# Patient Record
Sex: Female | Born: 1950 | Race: White | Hispanic: No | Marital: Married | Smoking: Former smoker
Health system: Southern US, Community
[De-identification: ages and names within clinical notes are randomized; demographics above are authoritative.]

## PROBLEM LIST (undated history)

## (undated) DIAGNOSIS — F909 Attention-deficit hyperactivity disorder, unspecified type: Secondary | ICD-10-CM

## (undated) DIAGNOSIS — I10 Essential (primary) hypertension: Secondary | ICD-10-CM

## (undated) DIAGNOSIS — J45909 Unspecified asthma, uncomplicated: Secondary | ICD-10-CM

## (undated) DIAGNOSIS — F329 Major depressive disorder, single episode, unspecified: Secondary | ICD-10-CM

## (undated) DIAGNOSIS — L719 Rosacea, unspecified: Secondary | ICD-10-CM

## (undated) DIAGNOSIS — E039 Hypothyroidism, unspecified: Secondary | ICD-10-CM

## (undated) DIAGNOSIS — Z9889 Other specified postprocedural states: Secondary | ICD-10-CM

## (undated) DIAGNOSIS — F419 Anxiety disorder, unspecified: Secondary | ICD-10-CM

## (undated) DIAGNOSIS — F32A Depression, unspecified: Secondary | ICD-10-CM

## (undated) DIAGNOSIS — R079 Chest pain, unspecified: Secondary | ICD-10-CM

## (undated) HISTORY — PX: PARATHYROIDECTOMY: SHX19

## (undated) HISTORY — DX: Anxiety disorder, unspecified: F41.9

## (undated) HISTORY — DX: Attention-deficit hyperactivity disorder, unspecified type: F90.9

## (undated) HISTORY — PX: TONSILLECTOMY: SUR1361

## (undated) HISTORY — DX: Essential (primary) hypertension: I10

## (undated) HISTORY — DX: Major depressive disorder, single episode, unspecified: F32.9

## (undated) HISTORY — DX: Other specified postprocedural states: Z98.890

## (undated) HISTORY — PX: THYROIDECTOMY: SHX17

## (undated) HISTORY — DX: Depression, unspecified: F32.A

## (undated) HISTORY — DX: Unspecified asthma, uncomplicated: J45.909

## (undated) HISTORY — DX: Hypothyroidism, unspecified: E03.9

## (undated) HISTORY — DX: Rosacea, unspecified: L71.9

## (undated) HISTORY — DX: Chest pain, unspecified: R07.9

---

## 1998-12-21 ENCOUNTER — Ambulatory Visit (HOSPITAL_COMMUNITY): Admission: EM | Admit: 1998-12-21 | Discharge: 1998-12-21 | Payer: Self-pay

## 2000-01-17 ENCOUNTER — Encounter: Payer: Self-pay | Admitting: Endocrinology

## 2000-01-17 ENCOUNTER — Encounter: Admission: RE | Admit: 2000-01-17 | Discharge: 2000-01-17 | Payer: Self-pay | Admitting: Endocrinology

## 2001-01-30 ENCOUNTER — Other Ambulatory Visit: Admission: RE | Admit: 2001-01-30 | Discharge: 2001-01-30 | Payer: Self-pay | Admitting: Obstetrics and Gynecology

## 2002-01-05 ENCOUNTER — Encounter: Payer: Self-pay | Admitting: Obstetrics and Gynecology

## 2002-01-05 ENCOUNTER — Encounter: Admission: RE | Admit: 2002-01-05 | Discharge: 2002-01-05 | Payer: Self-pay | Admitting: Obstetrics and Gynecology

## 2002-02-16 ENCOUNTER — Other Ambulatory Visit: Admission: RE | Admit: 2002-02-16 | Discharge: 2002-02-16 | Payer: Self-pay | Admitting: Obstetrics and Gynecology

## 2002-07-22 ENCOUNTER — Ambulatory Visit (HOSPITAL_COMMUNITY): Admission: RE | Admit: 2002-07-22 | Discharge: 2002-07-22 | Payer: Self-pay | Admitting: Pulmonary Disease

## 2002-08-18 ENCOUNTER — Other Ambulatory Visit: Admission: RE | Admit: 2002-08-18 | Discharge: 2002-08-18 | Payer: Self-pay | Admitting: Obstetrics and Gynecology

## 2002-11-04 ENCOUNTER — Ambulatory Visit (HOSPITAL_COMMUNITY): Admission: RE | Admit: 2002-11-04 | Discharge: 2002-11-04 | Payer: Self-pay | Admitting: Internal Medicine

## 2002-12-23 ENCOUNTER — Ambulatory Visit (HOSPITAL_COMMUNITY): Admission: RE | Admit: 2002-12-23 | Discharge: 2002-12-23 | Payer: Self-pay | Admitting: Pulmonary Disease

## 2003-03-16 ENCOUNTER — Encounter: Payer: Self-pay | Admitting: Obstetrics and Gynecology

## 2003-03-16 ENCOUNTER — Encounter: Admission: RE | Admit: 2003-03-16 | Discharge: 2003-03-16 | Payer: Self-pay | Admitting: Obstetrics and Gynecology

## 2003-03-21 ENCOUNTER — Other Ambulatory Visit: Admission: RE | Admit: 2003-03-21 | Discharge: 2003-03-21 | Payer: Self-pay | Admitting: Obstetrics and Gynecology

## 2003-11-09 DIAGNOSIS — D229 Melanocytic nevi, unspecified: Secondary | ICD-10-CM

## 2003-11-09 HISTORY — DX: Melanocytic nevi, unspecified: D22.9

## 2004-04-17 ENCOUNTER — Encounter: Admission: RE | Admit: 2004-04-17 | Discharge: 2004-04-17 | Payer: Self-pay | Admitting: Obstetrics and Gynecology

## 2004-04-25 ENCOUNTER — Encounter: Admission: RE | Admit: 2004-04-25 | Discharge: 2004-04-25 | Payer: Self-pay | Admitting: Obstetrics and Gynecology

## 2005-10-23 ENCOUNTER — Encounter: Admission: RE | Admit: 2005-10-23 | Discharge: 2005-10-23 | Payer: Self-pay | Admitting: Obstetrics and Gynecology

## 2005-11-19 ENCOUNTER — Encounter: Admission: RE | Admit: 2005-11-19 | Discharge: 2005-11-19 | Payer: Self-pay | Admitting: Obstetrics and Gynecology

## 2006-12-12 ENCOUNTER — Encounter: Admission: RE | Admit: 2006-12-12 | Discharge: 2006-12-12 | Payer: Self-pay | Admitting: Obstetrics and Gynecology

## 2008-01-12 ENCOUNTER — Encounter: Admission: RE | Admit: 2008-01-12 | Discharge: 2008-01-12 | Payer: Self-pay | Admitting: Obstetrics and Gynecology

## 2009-02-15 ENCOUNTER — Encounter: Admission: RE | Admit: 2009-02-15 | Discharge: 2009-02-15 | Payer: Self-pay | Admitting: Obstetrics and Gynecology

## 2010-03-07 ENCOUNTER — Encounter: Admission: RE | Admit: 2010-03-07 | Discharge: 2010-03-07 | Payer: Self-pay | Admitting: Obstetrics and Gynecology

## 2010-04-10 ENCOUNTER — Ambulatory Visit (HOSPITAL_COMMUNITY): Admission: RE | Admit: 2010-04-10 | Discharge: 2010-04-10 | Payer: Self-pay | Admitting: Pulmonary Disease

## 2010-06-17 ENCOUNTER — Encounter: Payer: Self-pay | Admitting: Obstetrics and Gynecology

## 2010-10-12 NOTE — Op Note (Signed)
   NAME:  Olivia Arnold, Olivia Arnold                              ACCOUNT NO.:  [id number]   MEDICAL RECORD NO.:  [id number]                   PATIENT TYPE:  AMB   LOCATION:  DAY                                  FACILITY:  [facility]   PHYSICIAN:  Lionel December, M.D.                 DATE OF BIRTH:  09-06-1950   DATE OF PROCEDURE:  11/04/2002  DATE OF DISCHARGE:                                 OPERATIVE REPORT   PROCEDURE:  Total colonoscopy.   ENDOSCOPIST:  Lionel December, M.D.   INDICATIONS:  Tuesday is a 60 year old Caucasian female who is undergoing  screening colonoscopy.  She does not have any GI symptoms.  The procedures  were reviewed with the patient and informed consent was obtained.   PREOPERATIVE MEDICATIONS:  Fentanyl 50 mcg IV and Versed 7 mg IV in divided  dose.   INSTRUMENT:  Olympus video system.   FINDINGS:  Procedure performed in endoscopy suite.  The patient's vital  signs and O2 saturation were monitored during the procedure and remained  stable.  The patient was placed in the left lateral recumbent position and  rectal examination was performed.  This was within normal limits.   The scope was placed in the rectum and advanced under vision into the  sigmoid colon and beyond.  Preparation was excellent.  Scope was passed into  the cecum which was identified by intravenous and appendiceal orifice.  Pictures were taken for the record.  As the scope was withdrawn, the colonic  mucosa was carefully examined.  There were no polyps, tumor masses, or other  abnormalities.  The rectal mucosa was normal.   The scope was retroflexed to examine the anorectal junction which revealed  small hemorrhoids below the dentate line.  The endoscope was straightened  and withdrawn.  The patient tolerated the procedure well.    FINAL DIAGNOSIS:  Small external hemorrhoids otherwise normal colonoscopy.   RECOMMENDATIONS:  She should continue yearly Hemoccults and consider next  screening 10 years from  now.                                               Lionel December, M.D.    NR/MEDQ  D:  11/04/2002  T:  11/04/2002  Job:  [id number]   cc:   Ramon Dredge L. Juanetta Gosling, M.D.  [address]  [place]  [place] [address]  Fax: [phone]

## 2010-10-12 NOTE — Procedures (Signed)
   NAME:  Olivia Arnold, Olivia Arnold                              ACCOUNT NO.:  [id number]   MEDICAL RECORD NO.:  [id number]                   PATIENT TYPE:  OUT   LOCATION:  RESP                                 FACILITY:  [facility]   PHYSICIAN:  Edward L. Juanetta Gosling, M.D.             DATE OF BIRTH:  Sep 28, 1950   DATE OF PROCEDURE:  07/22/2002  DATE OF DISCHARGE:                              PULMONARY FUNCTION TEST   Spirometry shows mild air flow obstruction, most marked in the smaller  airways.                                               Edward L. Juanetta Gosling, M.D.    ELH/MEDQ  D:  07/22/2002  T:  07/23/2002  Job:  [id number]

## 2011-03-29 ENCOUNTER — Other Ambulatory Visit: Payer: Self-pay | Admitting: Obstetrics and Gynecology

## 2011-03-29 DIAGNOSIS — Z1231 Encounter for screening mammogram for malignant neoplasm of breast: Secondary | ICD-10-CM

## 2011-04-23 ENCOUNTER — Ambulatory Visit: Payer: Self-pay

## 2011-05-07 ENCOUNTER — Ambulatory Visit
Admission: RE | Admit: 2011-05-07 | Discharge: 2011-05-07 | Disposition: A | Discharge status: Home / Self Care | Payer: BC Managed Care – PPO | Source: Ambulatory Visit | Attending: Obstetrics and Gynecology | Admitting: Obstetrics and Gynecology

## 2011-05-07 DIAGNOSIS — Z1231 Encounter for screening mammogram for malignant neoplasm of breast: Secondary | ICD-10-CM

## 2012-05-29 ENCOUNTER — Other Ambulatory Visit: Payer: Self-pay | Admitting: Obstetrics and Gynecology

## 2012-05-29 DIAGNOSIS — Z1231 Encounter for screening mammogram for malignant neoplasm of breast: Secondary | ICD-10-CM

## 2012-06-26 ENCOUNTER — Ambulatory Visit
Admission: RE | Admit: 2012-06-26 | Discharge: 2012-06-26 | Disposition: A | Discharge status: Home / Self Care | Payer: BC Managed Care – PPO | Source: Ambulatory Visit | Attending: Obstetrics and Gynecology | Admitting: Obstetrics and Gynecology

## 2012-06-26 DIAGNOSIS — Z1231 Encounter for screening mammogram for malignant neoplasm of breast: Secondary | ICD-10-CM

## 2012-06-30 ENCOUNTER — Other Ambulatory Visit: Payer: Self-pay | Admitting: Obstetrics and Gynecology

## 2012-06-30 DIAGNOSIS — R928 Other abnormal and inconclusive findings on diagnostic imaging of breast: Secondary | ICD-10-CM

## 2012-07-10 ENCOUNTER — Other Ambulatory Visit: Payer: BC Managed Care – PPO

## 2012-07-21 ENCOUNTER — Other Ambulatory Visit: Payer: BC Managed Care – PPO

## 2012-07-31 ENCOUNTER — Other Ambulatory Visit: Payer: BC Managed Care – PPO

## 2012-08-11 ENCOUNTER — Other Ambulatory Visit: Payer: BC Managed Care – PPO

## 2012-08-21 ENCOUNTER — Ambulatory Visit
Admission: RE | Admit: 2012-08-21 | Discharge: 2012-08-21 | Disposition: A | Discharge status: Home / Self Care | Payer: BC Managed Care – PPO | Source: Ambulatory Visit | Attending: Obstetrics and Gynecology | Admitting: Obstetrics and Gynecology

## 2012-08-21 DIAGNOSIS — R928 Other abnormal and inconclusive findings on diagnostic imaging of breast: Secondary | ICD-10-CM

## 2013-08-23 ENCOUNTER — Other Ambulatory Visit: Payer: Self-pay

## 2013-08-23 DIAGNOSIS — Z1231 Encounter for screening mammogram for malignant neoplasm of breast: Secondary | ICD-10-CM

## 2013-09-20 ENCOUNTER — Ambulatory Visit
Admission: RE | Admit: 2013-09-20 | Discharge: 2013-09-20 | Disposition: A | Discharge status: Home / Self Care | Payer: BC Managed Care – PPO | Source: Ambulatory Visit

## 2013-09-20 DIAGNOSIS — Z1231 Encounter for screening mammogram for malignant neoplasm of breast: Secondary | ICD-10-CM

## 2014-08-11 ENCOUNTER — Other Ambulatory Visit (HOSPITAL_COMMUNITY): Payer: Self-pay | Admitting: Radiology

## 2014-08-11 DIAGNOSIS — J45909 Unspecified asthma, uncomplicated: Secondary | ICD-10-CM

## 2014-08-24 ENCOUNTER — Ambulatory Visit (HOSPITAL_COMMUNITY)
Admission: RE | Admit: 2014-08-24 | Discharge: 2014-08-24 | Disposition: A | Discharge status: Home / Self Care | Payer: BC Managed Care – PPO | Source: Ambulatory Visit | Attending: Pulmonary Disease | Admitting: Pulmonary Disease

## 2014-08-24 DIAGNOSIS — J45909 Unspecified asthma, uncomplicated: Secondary | ICD-10-CM | POA: Insufficient documentation

## 2014-08-24 MED ORDER — ALBUTEROL SULFATE (2.5 MG/3ML) 0.083% IN NEBU
2.5000 mg | INHALATION_SOLUTION | Freq: Once | RESPIRATORY_TRACT | Status: AC
Start: 2014-08-24 — End: 2014-08-24
  Administered 2014-08-24: 2.5 mg via RESPIRATORY_TRACT

## 2014-09-01 LAB — PULMONARY FUNCTION TEST
DL/VA % PRED: 76 %
DL/VA: 3.66 ml/min/mmHg/L
DLCO UNC % PRED: 68 %
DLCO UNC: 16.71 ml/min/mmHg
FEF 25-75 POST: 2.06 L/s
FEF 25-75 PRE: 1.89 L/s
FEF2575-%Change-Post: 8 %
FEF2575-%PRED-POST: 93 %
FEF2575-%Pred-Pre: 85 %
FEV1-%CHANGE-POST: 3 %
FEV1-%Pred-Post: 95 %
FEV1-%Pred-Pre: 92 %
FEV1-Post: 2.35 L
FEV1-Pre: 2.28 L
FEV1FVC-%CHANGE-POST: 4 %
FEV1FVC-%Pred-Pre: 98 %
FEV6-%Change-Post: -1 %
FEV6-%Pred-Post: 96 %
FEV6-%Pred-Pre: 97 %
FEV6-POST: 2.97 L
FEV6-PRE: 3.01 L
FEV6FVC-%CHANGE-POST: 0 %
FEV6FVC-%PRED-POST: 104 %
FEV6FVC-%Pred-Pre: 104 %
FVC-%Change-Post: -1 %
FVC-%PRED-POST: 92 %
FVC-%PRED-PRE: 93 %
FVC-POST: 2.97 L
FVC-Pre: 3.01 L
POST FEV1/FVC RATIO: 79 %
Post FEV6/FVC ratio: 100 %
Pre FEV1/FVC ratio: 76 %
Pre FEV6/FVC Ratio: 100 %
RV % PRED: 98 %
RV: 2.02 L
TLC % PRED: 97 %
TLC: 4.93 L

## 2014-09-14 ENCOUNTER — Ambulatory Visit (HOSPITAL_COMMUNITY)
Admission: RE | Admit: 2014-09-14 | Discharge: 2014-09-14 | Disposition: A | Discharge status: Home / Self Care | Payer: BC Managed Care – PPO | Source: Ambulatory Visit | Attending: Pulmonary Disease | Admitting: Pulmonary Disease

## 2014-09-14 ENCOUNTER — Other Ambulatory Visit (HOSPITAL_COMMUNITY): Payer: Self-pay | Admitting: Pulmonary Disease

## 2014-09-14 DIAGNOSIS — M79641 Pain in right hand: Secondary | ICD-10-CM | POA: Diagnosis present

## 2014-09-20 DIAGNOSIS — E039 Hypothyroidism, unspecified: Secondary | ICD-10-CM | POA: Insufficient documentation

## 2014-09-20 DIAGNOSIS — F988 Other specified behavioral and emotional disorders with onset usually occurring in childhood and adolescence: Secondary | ICD-10-CM | POA: Insufficient documentation

## 2014-09-20 DIAGNOSIS — F329 Major depressive disorder, single episode, unspecified: Secondary | ICD-10-CM | POA: Insufficient documentation

## 2014-09-20 DIAGNOSIS — J45909 Unspecified asthma, uncomplicated: Secondary | ICD-10-CM | POA: Insufficient documentation

## 2014-09-20 DIAGNOSIS — F320 Major depressive disorder, single episode, mild: Secondary | ICD-10-CM

## 2014-09-20 DIAGNOSIS — F419 Anxiety disorder, unspecified: Secondary | ICD-10-CM | POA: Insufficient documentation

## 2014-09-21 ENCOUNTER — Encounter: Payer: Self-pay | Admitting: Cardiology

## 2014-09-21 ENCOUNTER — Ambulatory Visit (INDEPENDENT_AMBULATORY_CARE_PROVIDER_SITE_OTHER): Payer: BC Managed Care – PPO | Admitting: Cardiology

## 2014-09-21 VITALS — BP 136/76 | HR 83 | Ht 64.0 in | Wt 175.0 lb

## 2014-09-21 DIAGNOSIS — Z136 Encounter for screening for cardiovascular disorders: Secondary | ICD-10-CM | POA: Diagnosis not present

## 2014-09-21 DIAGNOSIS — F17201 Nicotine dependence, unspecified, in remission: Secondary | ICD-10-CM

## 2014-09-21 DIAGNOSIS — R0602 Shortness of breath: Secondary | ICD-10-CM | POA: Diagnosis not present

## 2014-09-21 DIAGNOSIS — Z8249 Family history of ischemic heart disease and other diseases of the circulatory system: Secondary | ICD-10-CM | POA: Diagnosis not present

## 2014-09-21 DIAGNOSIS — I10 Essential (primary) hypertension: Secondary | ICD-10-CM | POA: Diagnosis not present

## 2014-09-21 NOTE — Progress Notes (Signed)
Cardiology Office Note  Date: 09/21/2014   ID: Olivia Arnold, DOB 1951/01/28, MRN [id number]  PCP: Alonza Bogus, MD  Primary Cardiologist: Rozann Lesches, MD   Chief Complaint  Patient presents with  . Cardiac evaluation    History of Present Illness: Olivia Arnold is a 64 y.o. female referred for cardiology consultation by Dr. Luan Pulling. She is here today with a friend helping her to take notes and remember details of the office visit. She is referred after having had an episode of exertional shortness of breath that occurred after walking up a flight of steep steps carrying a baby. This happened approximately 3-4 weeks ago when she was in [place] for a wedding. She tells me that he has 2 brothers with diagnosed CAD, 1 status post heart attack at age 72, and the other just recently diagnosed with multivessel disease requiring CABG in his early 35s. She reports a personal history of hypertension, has not had problems with significantly elevated lipids or diabetes mellitus. She actually underwent a heart catheterization at [facility] back in 2000 that demonstrated normal coronary arteries - I reviewed a note from Dr. Clovia Cuff.  She recently had normal PFTs in early April per Dr. Luan Pulling. Otherwise has not undergone any recent ischemic testing.  ECG obtained today was normal.  I reviewed her recent lab work from Dr. Luan Pulling.  She describes herself as being active, enjoys [place]aking [place], also hiking. She does not endorse any reproducible angina symptoms or claudication.   Past Medical History  Diagnosis Date  . Hypothyroidism   . Depression   . Anxiety   . ADHD (attention deficit hyperactivity disorder)   . Asthma   . History of cardiac catheterization     [facility] July 2000 - normal coronaries  . Essential hypertension     Past Surgical History  Procedure Laterality Date  . Parathyroidectomy    . Tonsillectomy    . Thyroidectomy      Current Outpatient  Prescriptions  Medication Sig Dispense Refill  . ALPRAZolam (XANAX) 1 MG tablet Take 1 mg by mouth 3 (three) times daily as needed for anxiety.    Marland Kitchen amphetamine-dextroamphetamine (ADDERALL) 20 MG tablet Take 20 mg by mouth daily.    . DULoxetine (CYMBALTA) 60 MG capsule Take 60 mg by mouth daily.    . folic acid (FOLVITE) 1 MG tablet Take 1 mg by mouth daily.    Marland Kitchen levothyroxine (SYNTHROID, LEVOTHROID) 100 MCG tablet Take 100 mcg by mouth daily before breakfast.    . losartan (COZAAR) 100 MG tablet Take 100 mg by mouth daily.    . montelukast (SINGULAIR) 10 MG tablet Take 10 mg by mouth at bedtime.    . Omega-3 Fatty Acids (FISH OIL) 1000 MG CAPS Take 1,000 mg by mouth daily.    . vitamin B-12 (CYANOCOBALAMIN) 500 MCG tablet Take 500 mcg by mouth daily.     No current facility-administered medications for this visit.    Allergies:  Review of patient's allergies indicates no known allergies.   Social History: The patient  reports that she has quit smoking. Her smoking use included Cigarettes. She started smoking about 46 years ago. She smoked 1.00 pack per day. She has never used smokeless tobacco. She reports that she drinks alcohol. She reports that she does not use illicit drugs.   Family History: The patient's family history includes Aneurysm in her mother; CAD in her brother; Heart attack in her brother.  ROS:  Please see the history of present illness. Otherwise, complete review of systems is positive for anxiety.  All other systems are reviewed and negative.   Physical Exam: VS:  BP 136/76 mmHg  Pulse 83  Ht 5\' 4"  (1.626 m)  Wt 175 lb (79.379 kg)  BMI 30.02 kg/m2  SpO2 98%, BMI Body mass index is 30.02 kg/(m^2).  Wt Readings from Last 3 Encounters:  09/21/14 175 lb (79.379 kg)     General: Patient appears comfortable at rest. HEENT: Conjunctiva and lids normal, oropharynx clear with moist mucosa. Neck: Supple, no elevated JVP or carotid bruits, no thyromegaly. Lungs: Clear  to auscultation, nonlabored breathing at rest. Cardiac: Regular rate and rhythm, no S3 or significant systolic murmur, no pericardial rub. Abdomen: Soft, nontender, bowel sounds present. Extremities: No pitting edema, distal pulses 2+. Skin: Warm and dry. Musculoskeletal: No kyphosis. Neuropsychiatric: Alert and oriented x3, affect grossly appropriate.   ECG: ECG is ordered today and shows normal sinus rhythm.  Recent Labwork: 08/04/2014: Hemoglobin 13.2, platelets 372, potassium 4.5, BUN 22, creatinine 0.7, AST 18, ALT 18, cholesterol 185, triglycerides 133, HDL 53, LDL 105, TSH 1.6   ASSESSMENT AND PLAN:  1. Recent episode of exertional shortness of breath, symptoms not typical with her regular activities. She has a family history of premature cardiovascular disease in brothers, personal history of hypertension. Remote cardiac catheterization at [facility] in 2000 showed normal coronary arteries, she has not had interval ischemic workup. Recent PFTs were normal as well. ECG today is normal. Plan is to proceed with an exercise echocardiogram for further risk stratification.  2. Essential hypertension, on Cozaar.  3. Prior history of tobacco use long-term.  Current medicines were reviewed at length with the patient today.   Orders Placed This Encounter  Procedures  . EKG 12-Lead  . Echocardiogram stress test without contrast    Disposition: Call with test results.   Signed, Satira Sark, MD, [facility] 09/21/2014 3:26 PM    [place] [facility] [address], [place], [place] [address] Phone: [phone]; Fax: [phone]

## 2014-09-21 NOTE — Patient Instructions (Signed)
Your physician recommends that you schedule a follow-up appointment in: to be determined after test    Your physician has requested that you have a stress echocardiogram. For further information please visit [email or web address]. Please follow instruction sheet as given.       Thank you for choosing [facility] !

## 2014-09-26 ENCOUNTER — Ambulatory Visit (HOSPITAL_COMMUNITY)
Admission: RE | Admit: 2014-09-26 | Discharge: 2014-09-26 | Disposition: A | Discharge status: Home / Self Care | Payer: BC Managed Care – PPO | Source: Ambulatory Visit | Attending: Cardiology | Admitting: Cardiology

## 2014-09-26 DIAGNOSIS — Z8249 Family history of ischemic heart disease and other diseases of the circulatory system: Secondary | ICD-10-CM

## 2014-09-26 DIAGNOSIS — R0602 Shortness of breath: Secondary | ICD-10-CM

## 2014-09-26 DIAGNOSIS — R0600 Dyspnea, unspecified: Secondary | ICD-10-CM | POA: Insufficient documentation

## 2014-09-26 NOTE — Progress Notes (Signed)
Stress Lab Nurses Notes - [facility] Na  Olivia Arnold 09/26/2014 Reason for doing test: Dyspnea Type of test: Stress Echo Nurse performing test: Gerrit Halls, RN Nuclear Medicine Tech: Not Applicable Echo Tech: Titus Mould MD performing test: Myles Gip Family MD: Luan Pulling Test explained and consent signed: Yes.   IV started: No IV started Symptoms: SOB Treatment/Intervention: None Reason test stopped: fatigue After recovery IV was: NA Patient to return to Nuc. Med at : NA Patient discharged: Home Patient's Condition upon discharge was: stable Comments: During test Peak BP 181/62 & HR 150.  Recovery BP 129/70 & HR 89 .  Symptoms resolved in recovery. Geanie Cooley T

## 2014-09-28 ENCOUNTER — Telehealth: Payer: Self-pay | Admitting: Cardiovascular Disease

## 2014-09-28 NOTE — Telephone Encounter (Signed)
Patient wants results of tests / tg

## 2014-09-28 NOTE — Telephone Encounter (Signed)
LM that test has not been resulted and i will call her as soon as I get them

## 2014-10-05 NOTE — Telephone Encounter (Signed)
Patient would like to have her results from her test she had back in April!

## 2014-10-06 NOTE — Telephone Encounter (Signed)
Noted. I reviewed the stress test on May 2, the day that it was done. It does not look like the final report got merged into EPIC to show up as a final result, I suspect that it is related to the upgrade to the Cup[employer] system that occurred the day before. Please call the patient back and apologize for the delay in getting her a result. Overall the treadmill portion of the stress test indicates a moderate cardiovascular risk, which we suspected from her history alone. Importantly however, the echocardiographic images did not support any clear evidence of obstructive CAD at this point. I would recommend risk factor modification, however if she continues to have worsening symptoms, we can certainly pursue things further.

## 2014-10-06 NOTE — Telephone Encounter (Signed)
Pt requesting stress echo results done on 09/26/14.I am unable to fine resulted note

## 2014-10-06 NOTE — Telephone Encounter (Signed)
Copy routed to pcp

## 2014-10-06 NOTE — Telephone Encounter (Signed)
Patient notified of results.

## 2015-02-28 ENCOUNTER — Other Ambulatory Visit: Payer: Self-pay

## 2015-02-28 DIAGNOSIS — Z1231 Encounter for screening mammogram for malignant neoplasm of breast: Secondary | ICD-10-CM

## 2015-04-05 ENCOUNTER — Ambulatory Visit
Admission: RE | Admit: 2015-04-05 | Discharge: 2015-04-05 | Disposition: A | Discharge status: Home / Self Care | Payer: BC Managed Care – PPO | Source: Ambulatory Visit

## 2015-04-05 DIAGNOSIS — Z1231 Encounter for screening mammogram for malignant neoplasm of breast: Secondary | ICD-10-CM

## 2015-06-09 IMAGING — DX DG HAND COMPLETE 3+V*R*
3 series · 3 of 3 positions shown · non-contrast
Comparison: None.

CLINICAL DATA: Right hand pain. No known injury. Initial
evaluation.

EXAM:
RIGHT HAND - COMPLETE 3+ VIEW

[hand pa]
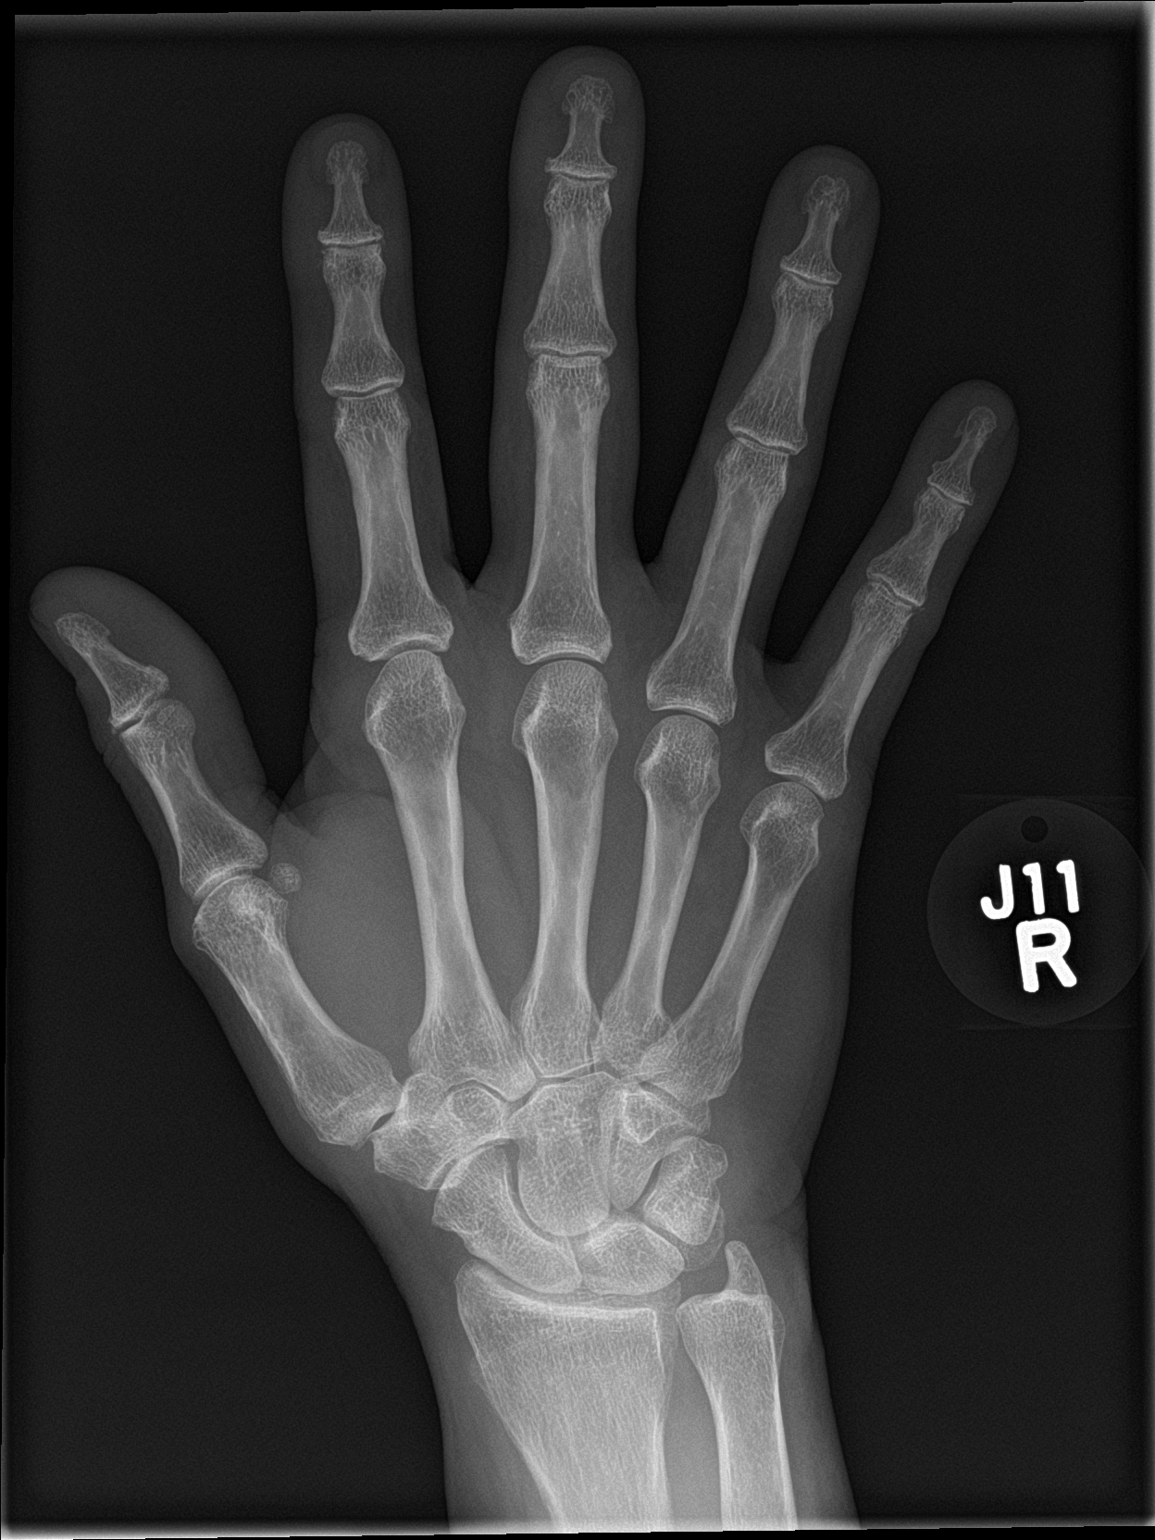

[hand obl]
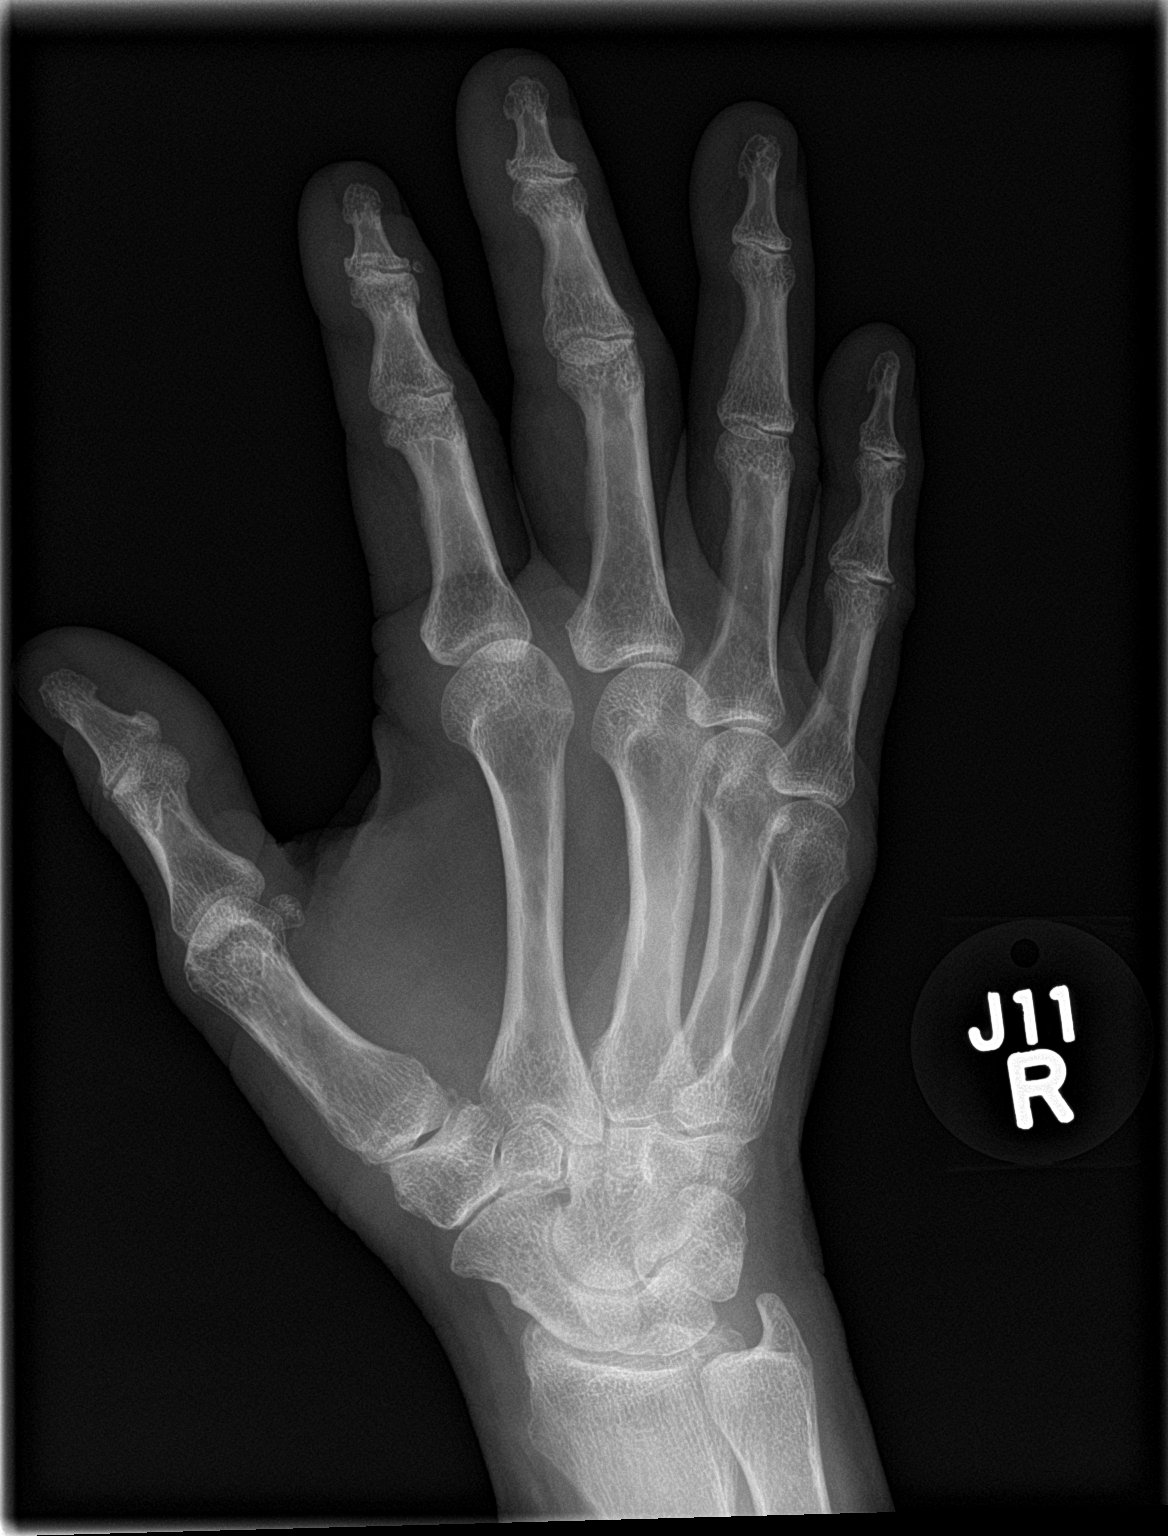

[hand lat]
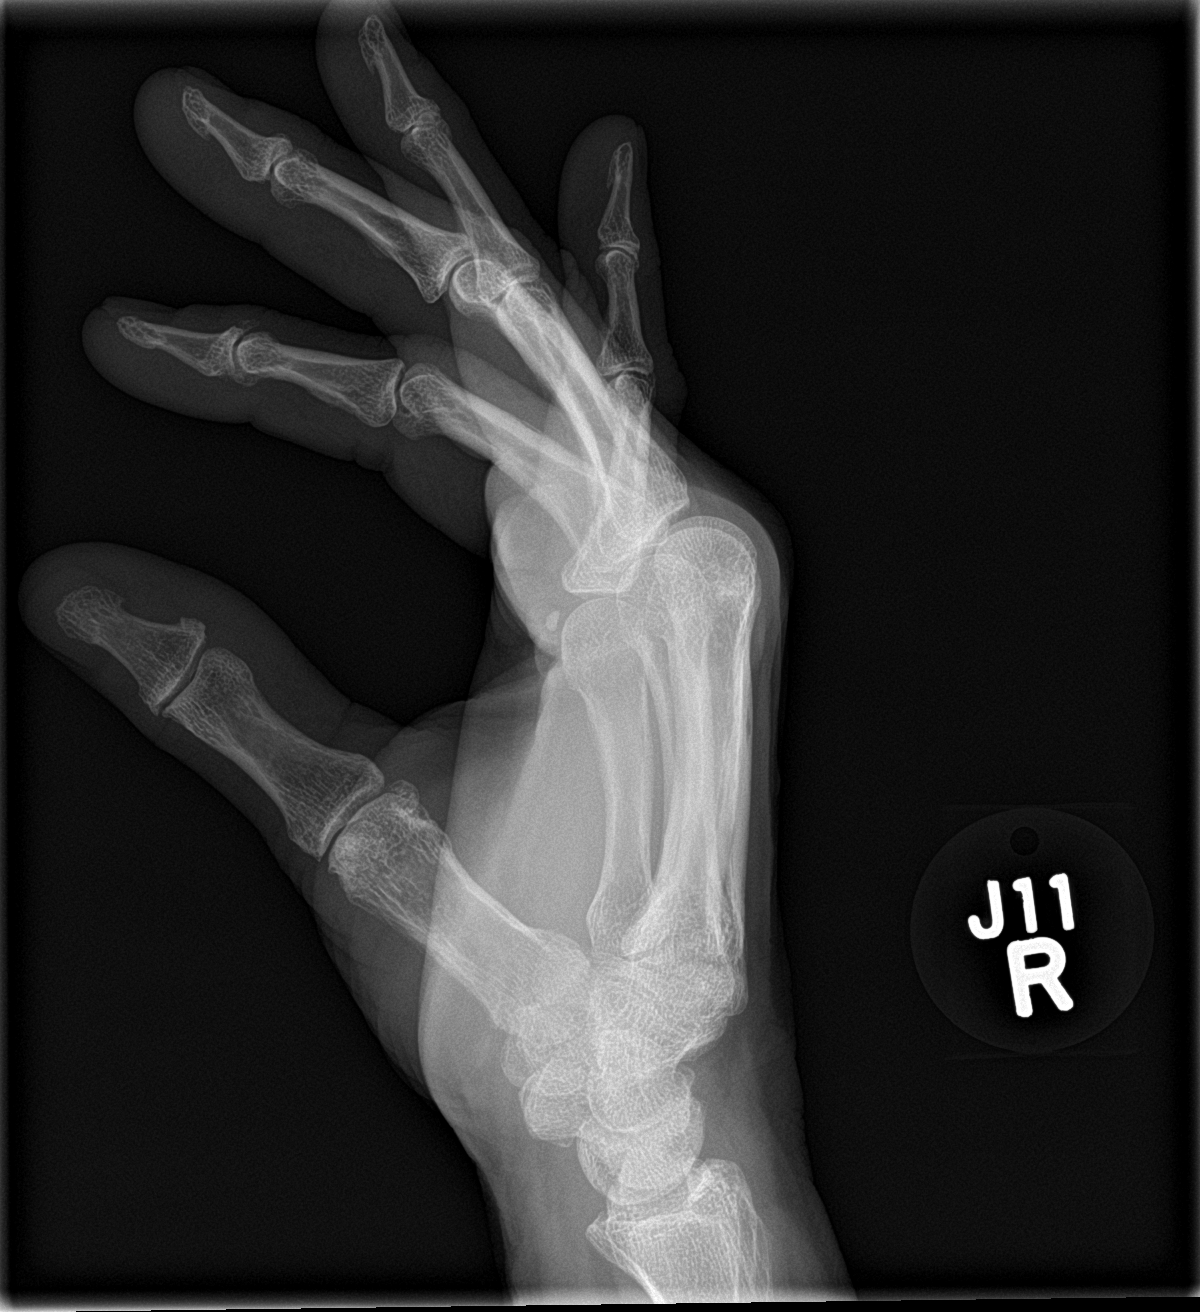

[3 of 3 positions shown; findings below may reference images not displayed]

FINDINGS: There is no evidence of fracture or dislocation. There is no
evidence of arthropathy or other focal bone abnormality. Soft
tissues are unremarkable.
IMPRESSION: No acute abnormality.

## 2015-07-17 ENCOUNTER — Encounter (HOSPITAL_COMMUNITY): Payer: Self-pay | Admitting: Emergency Medicine

## 2015-07-17 ENCOUNTER — Emergency Department (HOSPITAL_COMMUNITY)
Admission: EM | Admit: 2015-07-17 | Discharge: 2015-07-17 | Disposition: A | Discharge status: Home / Self Care | Payer: BC Managed Care – PPO | Attending: Emergency Medicine | Admitting: Emergency Medicine

## 2015-07-17 DIAGNOSIS — Y929 Unspecified place or not applicable: Secondary | ICD-10-CM | POA: Diagnosis not present

## 2015-07-17 DIAGNOSIS — Y9389 Activity, other specified: Secondary | ICD-10-CM | POA: Insufficient documentation

## 2015-07-17 DIAGNOSIS — W01118A Fall on same level from slipping, tripping and stumbling with subsequent striking against other sharp object, initial encounter: Secondary | ICD-10-CM | POA: Diagnosis not present

## 2015-07-17 DIAGNOSIS — J45909 Unspecified asthma, uncomplicated: Secondary | ICD-10-CM | POA: Insufficient documentation

## 2015-07-17 DIAGNOSIS — E039 Hypothyroidism, unspecified: Secondary | ICD-10-CM | POA: Insufficient documentation

## 2015-07-17 DIAGNOSIS — Z87891 Personal history of nicotine dependence: Secondary | ICD-10-CM | POA: Diagnosis not present

## 2015-07-17 DIAGNOSIS — I10 Essential (primary) hypertension: Secondary | ICD-10-CM | POA: Diagnosis not present

## 2015-07-17 DIAGNOSIS — Y998 Other external cause status: Secondary | ICD-10-CM | POA: Diagnosis not present

## 2015-07-17 DIAGNOSIS — S01422A Laceration with foreign body of left cheek and temporomandibular area, initial encounter: Secondary | ICD-10-CM | POA: Diagnosis not present

## 2015-07-17 DIAGNOSIS — Z79899 Other long term (current) drug therapy: Secondary | ICD-10-CM | POA: Diagnosis not present

## 2015-07-17 DIAGNOSIS — F329 Major depressive disorder, single episode, unspecified: Secondary | ICD-10-CM | POA: Insufficient documentation

## 2015-07-17 DIAGNOSIS — S0181XA Laceration without foreign body of other part of head, initial encounter: Secondary | ICD-10-CM

## 2015-07-17 DIAGNOSIS — S0993XA Unspecified injury of face, initial encounter: Secondary | ICD-10-CM | POA: Diagnosis present

## 2015-07-17 MED ORDER — LIDOCAINE HCL (PF) 1 % IJ SOLN
5.0000 mL | Freq: Once | INTRAMUSCULAR | Status: AC
  Administered 2015-07-17: 5 mL via INTRADERMAL
  Filled 2015-07-17: qty 5

## 2015-07-17 MED ORDER — BACITRACIN ZINC 500 UNIT/GM EX OINT
TOPICAL_OINTMENT | Freq: Once | CUTANEOUS | Status: AC
Start: 2015-07-17 — End: 2015-07-17
  Administered 2015-07-17: 1 via TOPICAL
  Filled 2015-07-17: qty 0.9

## 2015-07-17 MED ORDER — LIDOCAINE-EPINEPHRINE-TETRACAINE (LET) SOLUTION
3.0000 mL | Freq: Once | NASAL | Status: AC
  Administered 2015-07-17: 3 mL via TOPICAL
  Filled 2015-07-17: qty 3

## 2015-07-17 NOTE — Discharge Instructions (Signed)
Laceration Care, Adult  A laceration is a cut that goes through all layers of the skin. The cut also goes into the tissue that is right under the skin. Some cuts heal on their own. Others need to be closed with stitches (sutures), staples, skin adhesive strips, or wound glue. Taking care of your cut lowers your risk of infection and helps your cut to heal better.  HOW TO TAKE CARE OF YOUR CUT  For stitches or staples:  · Keep the wound clean and dry.  · If you were given a bandage (dressing), you should change it at least one time per day or as told by your doctor. You should also change it if it gets wet or dirty.  · Keep the wound completely dry for the first 24 hours or as told by your doctor. After that time, you may take a shower or a bath. However, make sure that the wound is not soaked in water until after the stitches or staples have been removed.  · Clean the wound one time each day or as told by your doctor:    Wash the wound with soap and water.    Rinse the wound with water until all of the soap comes off.    Pat the wound dry with a clean towel. Do not rub the wound.  · After you clean the wound, put a thin layer of antibiotic ointment on it as told by your doctor. This ointment:    Helps to prevent infection.    Keeps the bandage from sticking to the wound.  · Have your stitches or staples removed as told by your doctor.  If your doctor used skin adhesive strips:   · Keep the wound clean and dry.  · If you were given a bandage, you should change it at least one time per day or as told by your doctor. You should also change it if it gets dirty or wet.  · Do not get the skin adhesive strips wet. You can take a shower or a bath, but be careful to keep the wound dry.  · If the wound gets wet, pat it dry with a clean towel. Do not rub the wound.  · Skin adhesive strips fall off on their own. You can trim the strips as the wound heals. Do not remove any strips that are still stuck to the wound. They will  fall off after a while.  If your doctor used wound glue:  · Try to keep your wound dry, but you may briefly wet it in the shower or bath. Do not soak the wound in water, such as by swimming.  · After you take a shower or a bath, gently pat the wound dry with a clean towel. Do not rub the wound.  · Do not do any activities that will make you really sweaty until the skin glue has fallen off on its own.  · Do not apply liquid, cream, or ointment medicine to your wound while the skin glue is still on.  · If you were given a bandage, you should change it at least one time per day or as told by your doctor. You should also change it if it gets dirty or wet.  · If a bandage is placed over the wound, do not let the tape for the bandage touch the skin glue.  · Do not pick at the glue. The skin glue usually stays on for 5-10 days. Then, it   falls off of the skin.  General Instructions   · To help prevent scarring, make sure to cover your wound with sunscreen whenever you are outside after stitches are removed, after adhesive strips are removed, or when wound glue stays in place and the wound is healed. Make sure to wear a sunscreen of at least 30 SPF.  · Take over-the-counter and prescription medicines only as told by your doctor.  · If you were given antibiotic medicine or ointment, take or apply it as told by your doctor. Do not stop using the antibiotic even if your wound is getting better.  · Do not scratch or pick at the wound.  · Keep all follow-up visits as told by your doctor. This is important.  · Check your wound every day for signs of infection. Watch for:    Redness, swelling, or pain.    Fluid, blood, or pus.  · Raise (elevate) the injured area above the level of your heart while you are sitting or lying down, if possible.  GET HELP IF:  · You got a tetanus shot and you have any of these problems at the injection site:    Swelling.    Very bad pain.    Redness.    Bleeding.  · You have a fever.  · A wound that was  closed breaks open.  · You notice a bad smell coming from your wound or your bandage.  · You notice something coming out of the wound, such as wood or glass.  · Medicine does not help your pain.  · You have more redness, swelling, or pain at the site of your wound.  · You have fluid, blood, or pus coming from your wound.  · You notice a change in the color of your skin near your wound.  · You need to change the bandage often because fluid, blood, or pus is coming from the wound.  · You start to have a new rash.  · You start to have numbness around the wound.  GET HELP RIGHT AWAY IF:  · You have very bad swelling around the wound.  · Your pain suddenly gets worse and is very bad.  · You notice painful lumps near the wound or on skin that is anywhere on your body.  · You have a red streak going away from your wound.  · The wound is on your hand or foot and you cannot move a finger or toe like you usually can.  · The wound is on your hand or foot and you notice that your fingers or toes look pale or bluish.     This information is not intended to replace advice given to you by your health care provider. Make sure you discuss any questions you have with your health care provider.     Document Released: 10/30/2007 Document Revised: 09/27/2014 Document Reviewed: 05/09/2014  Elsevier Interactive Patient Education ©2016 [employer] Inc[employer]

## 2015-07-17 NOTE — ED Notes (Signed)
Pt states she tripped and fell on vase outside. Pt has laceration to the left cheek.

## 2015-07-17 NOTE — ED Notes (Signed)
Pt states understanding of care given and follow up instructions 

## 2015-07-17 NOTE — ED Provider Notes (Signed)
CSN: [id number]     Arrival date & time 07/17/15  1824 History   First MD Initiated Contact with Patient 07/17/15 2016     Chief Complaint  Patient presents with  . Facial Laceration     (Consider location/radiation/quality/duration/timing/severity/associated sxs/prior Treatment) HPI  Olivia Arnold is a 65 y.o. female who presents to the Emergency Department complaining of laceration to the left face that occurred just shortly before ED arrival.  She states that she tripped and fell over a glass vase causing the laceration.  She controlled the bleeding with application of pressure.  She also reports possible retained glass within the wound.  She denies head injury, neck pain, LOC, dizziness or headaches.     Past Medical History  Diagnosis Date  . Hypothyroidism   . Depression   . Anxiety   . ADHD (attention deficit hyperactivity disorder)   . Asthma   . History of cardiac catheterization     [facility] July 2000 - normal coronaries  . Essential hypertension    Past Surgical History  Procedure Laterality Date  . Parathyroidectomy    . Tonsillectomy    . Thyroidectomy     Family History  Problem Relation Age of Onset  . Heart attack Brother     Died age 2  . CAD Brother     Diagnosed age 5  . Aneurysm Mother     Brain   Social History  Substance Use Topics  . Smoking status: Former Smoker -- 1.00 packs/day    Types: Cigarettes    Start date: 09/20/1968  . Smokeless tobacco: Never Used  . Alcohol Use: 0.0 oz/week    0 Standard drinks or equivalent per week   OB History    No data available     Review of Systems  Constitutional: Negative for fever and chills.  Eyes: Negative for pain and visual disturbance.  Gastrointestinal: Negative for nausea and vomiting.  Musculoskeletal: Negative for back pain, joint swelling, arthralgias and neck pain.  Skin: Positive for wound. Negative for color change.       Laceration left cheek   Neurological: Negative for dizziness,  weakness, numbness and headaches.  Hematological: Does not bruise/bleed easily.  All other systems reviewed and are negative.     Allergies  Review of patient's allergies indicates no known allergies.  Home Medications   Prior to Admission medications   Medication Sig Start Date End Date Taking? Authorizing Provider  ALPRAZolam Duanne Moron) 1 MG tablet Take 0.5 mg by mouth at bedtime.    Yes Historical Provider, MD  amphetamine-dextroamphetamine (ADDERALL) 20 MG tablet Take 20 mg by mouth daily.   Yes Historical Provider, MD  Cholecalciferol (VITAMIN D PO) Take 1 capsule by mouth daily.   Yes Historical Provider, MD  DULoxetine (CYMBALTA) 60 MG capsule Take 120 mg by mouth every evening.    Yes Historical Provider, MD  folic acid (FOLVITE) 1 MG tablet Take 1 mg by mouth daily.   Yes Historical Provider, MD  levothyroxine (SYNTHROID, LEVOTHROID) 100 MCG tablet Take 100 mcg by mouth daily before breakfast.   Yes Historical Provider, MD  losartan (COZAAR) 100 MG tablet Take 100 mg by mouth daily.   Yes Historical Provider, MD  montelukast (SINGULAIR) 10 MG tablet Take 10 mg by mouth at bedtime.   Yes Historical Provider, MD  Omega-3 Fatty Acids (FISH OIL) 1000 MG CAPS Take 1,000 mg by mouth daily.   Yes Historical Provider, MD  vitamin B-12 (CYANOCOBALAMIN) 500 MCG tablet  Take 500 mcg by mouth daily.   Yes Historical Provider, MD   BP 153/50 mmHg  Pulse 86  Temp(Src) 97.5 F (36.4 C) (Oral)  Resp 20  Ht 5\' 4"  (1.626 m)  SpO2 97% Physical Exam  Constitutional: She is oriented to person, place, and time. She appears well-developed and well-nourished. No distress.  HENT:  Head: Normocephalic. Head is with laceration.    Right Ear: Tympanic membrane and ear canal normal.  Left Ear: Tympanic membrane and ear canal normal.  Mouth/Throat: Uvula is midline, oropharynx is clear and moist and mucous membranes are normal.  Irregular laceration left cheek. Bleeding controlled.  No edema  Eyes:  Conjunctivae and EOM are normal. Pupils are equal, round, and reactive to light.  Neck: Normal range of motion. Neck supple.  Cardiovascular: Normal rate, regular rhythm, normal heart sounds and intact distal pulses.   No murmur heard. Pulmonary/Chest: Effort normal and breath sounds normal. No respiratory distress.  Musculoskeletal: Normal range of motion. She exhibits no edema or tenderness.  Neurological: She is alert and oriented to person, place, and time. She exhibits normal muscle tone. Coordination normal.  Skin: Skin is warm.  Nursing note and vitals reviewed.   ED Course  Procedures (including critical care time) Labs Review Labs Reviewed - No data to display  Imaging Review No results found. I have personally reviewed and evaluated these images and lab results as part of my medical decision-making.   EKG Interpretation None       LACERATION REPAIR Performed by: Hammond Obeirne L. Authorized by: Hale Bogus Consent: Verbal consent obtained. Risks and benefits: risks, benefits and alternatives were discussed Consent given by: patient Patient identity confirmed: provided demographic data Prepped and Draped in normal sterile fashion Wound explored  Laceration Location: left face  Laceration Length: 2 cm, irregular laceration, complicated closure  2 small glass foreign bodies removed by me using forceps  Anesthesia: topical, local infiltration  Local anesthetic: LET, lidocaine 1 % w/o epinephrine  Anesthetic total: 3  mL, 1 mL respectively  Irrigation method: syringe Amount of cleaning: standard  Skin closure: 6-0 prolene  Number of sutures: 4  Technique: simple interrupted  Patient tolerance: Patient tolerated the procedure well with no immediate complications.   MDM   Final diagnoses:  Facial laceration, initial encounter    Td is UTD.  Wound edges well approximated.  Pt agrees to wound care instructions, sutures out in 7 days.  Advised to  return for any signs of infection.  Pt appears stable for d/c    Kem Parkinson, PA-C 07/20/15 2121  Tanna Furry, MD 07/30/15 [phone]

## 2015-12-19 ENCOUNTER — Encounter: Payer: Self-pay | Admitting: Family Medicine

## 2016-05-27 HISTORY — PX: CATARACT EXTRACTION: SUR2

## 2016-06-06 ENCOUNTER — Other Ambulatory Visit: Payer: Self-pay | Admitting: Obstetrics and Gynecology

## 2016-06-06 DIAGNOSIS — Z1231 Encounter for screening mammogram for malignant neoplasm of breast: Secondary | ICD-10-CM

## 2016-07-25 ENCOUNTER — Ambulatory Visit
Admission: RE | Admit: 2016-07-25 | Discharge: 2016-07-25 | Disposition: A | Discharge status: Home / Self Care | Payer: Medicare Other | Source: Ambulatory Visit | Attending: Obstetrics and Gynecology | Admitting: Obstetrics and Gynecology

## 2016-07-25 DIAGNOSIS — Z1231 Encounter for screening mammogram for malignant neoplasm of breast: Secondary | ICD-10-CM

## 2017-06-06 LAB — VITAMIN D 25 HYDROXY (VIT D DEFICIENCY, FRACTURES): Vit D, 25-Hydroxy: 22

## 2017-06-06 LAB — HEMOGLOBIN A1C: Hemoglobin A1C: 6.1

## 2017-06-25 ENCOUNTER — Other Ambulatory Visit: Payer: Self-pay | Admitting: Obstetrics and Gynecology

## 2017-06-25 DIAGNOSIS — Z1231 Encounter for screening mammogram for malignant neoplasm of breast: Secondary | ICD-10-CM

## 2017-07-28 ENCOUNTER — Ambulatory Visit: Payer: Medicare Other

## 2017-08-13 ENCOUNTER — Ambulatory Visit: Payer: Medicare Other

## 2017-09-05 ENCOUNTER — Ambulatory Visit: Payer: Medicare Other

## 2017-11-11 ENCOUNTER — Ambulatory Visit: Payer: Medicare Other

## 2017-12-05 ENCOUNTER — Ambulatory Visit
Admission: RE | Admit: 2017-12-05 | Discharge: 2017-12-05 | Disposition: A | Discharge status: Home / Self Care | Payer: Medicare Other | Source: Ambulatory Visit | Attending: Obstetrics and Gynecology | Admitting: Obstetrics and Gynecology

## 2017-12-05 DIAGNOSIS — Z1231 Encounter for screening mammogram for malignant neoplasm of breast: Secondary | ICD-10-CM

## 2018-02-01 ENCOUNTER — Emergency Department (HOSPITAL_COMMUNITY)
Admission: EM | Admit: 2018-02-01 | Discharge: 2018-02-01 | Disposition: A | Discharge status: Home / Self Care | Payer: Medicare Other | Attending: Emergency Medicine | Admitting: Emergency Medicine

## 2018-02-01 ENCOUNTER — Encounter (HOSPITAL_COMMUNITY): Payer: Self-pay

## 2018-02-01 DIAGNOSIS — E039 Hypothyroidism, unspecified: Secondary | ICD-10-CM | POA: Diagnosis not present

## 2018-02-01 DIAGNOSIS — J45909 Unspecified asthma, uncomplicated: Secondary | ICD-10-CM | POA: Insufficient documentation

## 2018-02-01 DIAGNOSIS — F419 Anxiety disorder, unspecified: Secondary | ICD-10-CM | POA: Diagnosis not present

## 2018-02-01 DIAGNOSIS — Z79899 Other long term (current) drug therapy: Secondary | ICD-10-CM | POA: Diagnosis not present

## 2018-02-01 DIAGNOSIS — F909 Attention-deficit hyperactivity disorder, unspecified type: Secondary | ICD-10-CM | POA: Diagnosis not present

## 2018-02-01 DIAGNOSIS — L739 Follicular disorder, unspecified: Secondary | ICD-10-CM

## 2018-02-01 DIAGNOSIS — I10 Essential (primary) hypertension: Secondary | ICD-10-CM | POA: Diagnosis not present

## 2018-02-01 DIAGNOSIS — F329 Major depressive disorder, single episode, unspecified: Secondary | ICD-10-CM | POA: Insufficient documentation

## 2018-02-01 DIAGNOSIS — L0202 Furuncle of face: Secondary | ICD-10-CM | POA: Insufficient documentation

## 2018-02-01 DIAGNOSIS — R220 Localized swelling, mass and lump, head: Secondary | ICD-10-CM | POA: Diagnosis present

## 2018-02-01 DIAGNOSIS — Z87891 Personal history of nicotine dependence: Secondary | ICD-10-CM | POA: Insufficient documentation

## 2018-02-01 MED ORDER — MUPIROCIN 2 % EX OINT: TOPICAL_OINTMENT | CUTANEOUS | 0 refills | Status: DC

## 2018-02-01 MED ORDER — DOXYCYCLINE HYCLATE 100 MG PO TABS
100.0000 mg | ORAL_TABLET | Freq: Once | ORAL | Status: AC
  Administered 2018-02-01: 100 mg via ORAL
  Filled 2018-02-01: qty 1

## 2018-02-01 MED ORDER — DOXYCYCLINE HYCLATE 100 MG PO CAPS: 100.0000 mg | ORAL_CAPSULE | Freq: Two times a day (BID) | ORAL | 0 refills | Status: DC

## 2018-02-01 NOTE — Discharge Instructions (Addendum)
Continue warm wet compresses on and off to your chin.  Avoid picking at the affected area or squeezing it.  Take the antibiotic as directed until its finished.  Follow-up with your primary doctor or return here for any worsening symptoms such as increasing pain, increasing swelling or redness or difficulty swallowing.

## 2018-02-01 NOTE — ED Notes (Signed)
Pt with open wound to chin from where she pushed upon a ?boil Now chin is swollen  Pain unrelieved by ibuprofen that she has used since yesterday Also has used a cream her physician said to use on it without relief  Has used hot and cold upon area without relief of discomforture

## 2018-02-01 NOTE — ED Triage Notes (Addendum)
Pt reports that a bump came up on her chin yesterday and she picked at it, now its worse and increase pain in teeth and jaw

## 2018-02-03 NOTE — ED Provider Notes (Signed)
[facility] EMERGENCY DEPARTMENT Provider Note   CSN: [id number] Arrival date & time: 02/01/18  1102     History   Chief Complaint Chief Complaint  Patient presents with  . Abscess    HPI Olivia Arnold is a 67 y.o. female.  HPI   Olivia Arnold is a 67 y.o. female who presents to the Emergency Department complaining of pain and swelling of her chin.  She states that she had a "bump" to develop on her chin one day prior to arrival.  She states that she squeezed and picked at the area and woke up with increasing swelling and pain to her chin that radiates into her jaw and lower teeth.  Pain is associated with palpation of her chin and with chewing.  She is applied warm and cold compresses without relief.  She denies fever, chills, vomiting, neck pain or stiffness, and difficulty swallowing or breathing.  No history of diabetes or MRSA.   Past Medical History:  Diagnosis Date  . ADHD (attention deficit hyperactivity disorder)   . Anxiety   . Asthma   . Depression   . Essential hypertension   . History of cardiac catheterization    [facility] July 2000 - normal coronaries  . Hypothyroidism     Patient Active Problem List   Diagnosis Date Noted  . Hypothyroidism, adult 09/20/2014  . Major depression, single episode 09/20/2014  . Anxiety disorder 09/20/2014  . ADD (attention deficit disorder) 09/20/2014  . Asthma 09/20/2014    Past Surgical History:  Procedure Laterality Date  . PARATHYROIDECTOMY    . THYROIDECTOMY    . TONSILLECTOMY       OB History   None      Home Medications    Prior to Admission medications   Medication Sig Start Date End Date Taking? Authorizing Provider  ALPRAZolam Duanne Moron) 1 MG tablet Take 0.5 mg by mouth at bedtime.     [provider]  amphetamine-dextroamphetamine (ADDERALL) 20 MG tablet Take 20 mg by mouth daily.    [provider]  Cholecalciferol (VITAMIN D PO) Take 1 capsule by mouth daily.    [provider]    doxycycline (VIBRAMYCIN) 100 MG capsule Take 1 capsule (100 mg total) by mouth 2 (two) times daily. 02/01/18   Fay Swider, PA-C  DULoxetine (CYMBALTA) 60 MG capsule Take 120 mg by mouth every evening.     [provider]  folic acid (FOLVITE) 1 MG tablet Take 1 mg by mouth daily.    [provider]  levothyroxine (SYNTHROID, LEVOTHROID) 100 MCG tablet Take 100 mcg by mouth daily before breakfast.    [provider]  losartan (COZAAR) 100 MG tablet Take 100 mg by mouth daily.    [provider]  montelukast (SINGULAIR) 10 MG tablet Take 10 mg by mouth at bedtime.    [provider]  mupirocin ointment (BACTROBAN) 2 % Apply to the affected area 3 times a day for 10 days 02/01/18   Zailyn Rowser, PA-C  Omega-3 Fatty Acids (FISH OIL) 1000 MG CAPS Take 1,000 mg by mouth daily.    [provider]  vitamin B-12 (CYANOCOBALAMIN) 500 MCG tablet Take 500 mcg by mouth daily.    [provider]    Family History Family History  Problem Relation Age of Onset  . Heart attack Brother        Died age 100  . CAD Brother        Diagnosed age 39  .  Aneurysm Mother        Brain  . Breast cancer Sister 64    Social History Social History   Tobacco Use  . Smoking status: Former Smoker    Packs/day: 1.00    Types: Cigarettes    Start date: 09/20/1968  . Smokeless tobacco: Never Used  Substance Use Topics  . Alcohol use: Not Currently    Alcohol/week: 0.0 standard drinks  . Drug use: No     Allergies   Patient has no known allergies.   Review of Systems Review of Systems  Constitutional: Negative for chills and fever.  HENT: Positive for dental problem. Negative for trouble swallowing.        Pain to chin with swelling  Respiratory: Negative for shortness of breath.   Cardiovascular: Negative for chest pain.  Gastrointestinal: Negative for nausea and vomiting.  Musculoskeletal: Negative for arthralgias and joint swelling.   Neurological: Negative for weakness, numbness and headaches.  Hematological: Negative for adenopathy.     Physical Exam Updated Vital Signs BP (!) 165/64 (BP Location: Right Arm)   Pulse 86   Temp 98.1 F (36.7 C) (Oral)   Resp 16   Ht 5\' 4"  (1.626 m)   Wt 78.3 kg   SpO2 97%   BMI 29.64 kg/m   Physical Exam  Constitutional: She appears well-developed and well-nourished. No distress.  HENT:  Head: Normocephalic.  Right Ear: Tympanic membrane and ear canal normal.  Left Ear: Tympanic membrane and ear canal normal.  Mouth/Throat: Uvula is midline, oropharynx is clear and moist and mucous membranes are normal. No oral lesions. No trismus in the jaw. No uvula swelling.  Localized area of induration along the chin.  1 cm open pustule without significant drainage.  Several associated satellite lesions of the chin.  No significant erythema.  Cardiovascular: Normal rate and regular rhythm.  Pulmonary/Chest: Effort normal and breath sounds normal. No respiratory distress.  Neurological: She is alert. No sensory deficit.  Skin: Skin is warm. Capillary refill takes less than 2 seconds. No erythema.  Nursing note and vitals reviewed.    ED Treatments / Results  Labs (all labs ordered are listed, but only abnormal results are displayed) Labs Reviewed - No data to display  EKG None  Radiology No results found.  Procedures Procedures (including critical care time)  Medications Ordered in ED Medications  doxycycline (VIBRA-TABS) tablet 100 mg (100 mg Oral Given 02/01/18 1234)     Initial Impression / Assessment and Plan / ED Course  I have reviewed the triage vital signs and the nursing notes.  Pertinent labs & imaging results that were available during my care of the patient were reviewed by me and considered in my medical decision making (see chart for details).     Patient with small open pustule to the chin and several small satellite lesions.  Induration and edema of  the chin is felt to be secondary to trauma to the skin associated with squeezing.  No drainable abscess at present.  Patient agrees to care plan with warm wet compresses, antibiotics, and avoid squeezing or picking at the wound.  Return precautions discussed.  Airway is patent.  Final Clinical Impressions(s) / ED Diagnoses   Final diagnoses:  Folliculitis    ED Discharge Orders         Ordered    doxycycline (VIBRAMYCIN) 100 MG capsule  2 times daily     02/01/18 1221    mupirocin ointment (BACTROBAN) 2 %  02/01/18 [place], [place], PA-C 02/03/18 [place], [place], DO 02/03/18 1656

## 2018-07-10 ENCOUNTER — Other Ambulatory Visit: Payer: Self-pay | Admitting: Obstetrics and Gynecology

## 2018-07-17 ENCOUNTER — Other Ambulatory Visit: Payer: Self-pay | Admitting: Obstetrics and Gynecology

## 2018-07-17 DIAGNOSIS — E2839 Other primary ovarian failure: Secondary | ICD-10-CM

## 2018-07-17 DIAGNOSIS — Z780 Asymptomatic menopausal state: Secondary | ICD-10-CM

## 2018-10-05 ENCOUNTER — Other Ambulatory Visit: Payer: Medicare Other

## 2018-11-16 ENCOUNTER — Other Ambulatory Visit: Payer: Medicare Other

## 2018-11-16 ENCOUNTER — Other Ambulatory Visit: Payer: Self-pay

## 2018-11-16 DIAGNOSIS — Z20822 Contact with and (suspected) exposure to covid-19: Secondary | ICD-10-CM

## 2018-11-19 ENCOUNTER — Ambulatory Visit (INDEPENDENT_AMBULATORY_CARE_PROVIDER_SITE_OTHER): Payer: Medicare Other | Admitting: Otolaryngology

## 2018-11-19 DIAGNOSIS — H903 Sensorineural hearing loss, bilateral: Secondary | ICD-10-CM

## 2018-11-19 DIAGNOSIS — H608X3 Other otitis externa, bilateral: Secondary | ICD-10-CM

## 2018-11-19 LAB — NOVEL CORONAVIRUS, NAA: SARS-CoV-2, NAA: NOT DETECTED

## 2018-11-25 ENCOUNTER — Other Ambulatory Visit: Payer: Self-pay | Admitting: Obstetrics and Gynecology

## 2018-11-25 DIAGNOSIS — Z1231 Encounter for screening mammogram for malignant neoplasm of breast: Secondary | ICD-10-CM

## 2018-12-21 ENCOUNTER — Other Ambulatory Visit: Payer: Self-pay

## 2018-12-21 ENCOUNTER — Ambulatory Visit
Admission: RE | Admit: 2018-12-21 | Discharge: 2018-12-21 | Disposition: A | Discharge status: Home / Self Care | Payer: Medicare Other | Source: Ambulatory Visit | Attending: Obstetrics and Gynecology | Admitting: Obstetrics and Gynecology

## 2018-12-21 DIAGNOSIS — Z1231 Encounter for screening mammogram for malignant neoplasm of breast: Secondary | ICD-10-CM

## 2018-12-21 DIAGNOSIS — Z780 Asymptomatic menopausal state: Secondary | ICD-10-CM

## 2018-12-21 DIAGNOSIS — E2839 Other primary ovarian failure: Secondary | ICD-10-CM

## 2018-12-21 LAB — HM MAMMOGRAPHY

## 2018-12-21 LAB — HM DEXA SCAN: HM Dexa Scan: NORMAL

## 2019-04-09 LAB — BASIC METABOLIC PANEL
BUN: 22 — AB (ref 4–21)
CO2: 28 — AB (ref 13–22)
Chloride: 103 (ref 99–108)
Creatinine: 0.7 (ref <=1.1)
Glucose: 123
Potassium: 4.2 (ref 3.4–5.3)
Sodium: 139 (ref 137–147)

## 2019-04-09 LAB — COMPREHENSIVE METABOLIC PANEL
Albumin: 4.6 (ref 3.5–5.0)
Calcium: 9.9 (ref 8.7–10.7)
GFR calc Af Amer: 105
GFR calc non Af Amer: 91
Globulin: 2.1

## 2019-04-09 LAB — CBC AND DIFFERENTIAL
HCT: 42 (ref 36–46)
Hemoglobin: 14 (ref 12.0–16.0)
WBC: 8.9

## 2019-04-09 LAB — CBC: RBC: 4.62 (ref 3.87–5.11)

## 2019-04-09 LAB — LIPID PANEL
Cholesterol: 216 — AB (ref 0–200)
HDL: 50 (ref 35–70)
LDL Cholesterol: 133
Triglycerides: 192 — AB (ref 40–160)

## 2019-04-09 LAB — HEPATIC FUNCTION PANEL
ALT: 15 (ref 7–35)
AST: 16 (ref 13–35)
Alkaline Phosphatase: 54 (ref 25–125)

## 2019-04-09 LAB — TSH: TSH: 0.51 (ref <=5.90)

## 2019-04-21 DIAGNOSIS — I10 Essential (primary) hypertension: Secondary | ICD-10-CM | POA: Insufficient documentation

## 2019-04-21 DIAGNOSIS — F329 Major depressive disorder, single episode, unspecified: Secondary | ICD-10-CM

## 2019-04-21 DIAGNOSIS — E039 Hypothyroidism, unspecified: Secondary | ICD-10-CM

## 2019-04-21 DIAGNOSIS — F419 Anxiety disorder, unspecified: Secondary | ICD-10-CM

## 2019-04-21 DIAGNOSIS — L719 Rosacea, unspecified: Secondary | ICD-10-CM

## 2019-04-21 DIAGNOSIS — J45909 Unspecified asthma, uncomplicated: Secondary | ICD-10-CM

## 2019-04-21 DIAGNOSIS — R079 Chest pain, unspecified: Secondary | ICD-10-CM | POA: Insufficient documentation

## 2019-05-27 ENCOUNTER — Encounter (HOSPITAL_COMMUNITY): Payer: Self-pay | Admitting: Emergency Medicine

## 2019-05-27 ENCOUNTER — Other Ambulatory Visit: Payer: Self-pay

## 2019-05-27 ENCOUNTER — Emergency Department (HOSPITAL_COMMUNITY): Payer: Medicare Other

## 2019-05-27 ENCOUNTER — Emergency Department (HOSPITAL_COMMUNITY)
Admission: EM | Admit: 2019-05-27 | Discharge: 2019-05-27 | Disposition: A | Discharge status: Home / Self Care | Payer: Medicare Other | Attending: Emergency Medicine | Admitting: Emergency Medicine

## 2019-05-27 DIAGNOSIS — S060X0A Concussion without loss of consciousness, initial encounter: Secondary | ICD-10-CM

## 2019-05-27 DIAGNOSIS — Z87891 Personal history of nicotine dependence: Secondary | ICD-10-CM | POA: Insufficient documentation

## 2019-05-27 DIAGNOSIS — Z79899 Other long term (current) drug therapy: Secondary | ICD-10-CM | POA: Insufficient documentation

## 2019-05-27 DIAGNOSIS — Y9383 Activity, rough housing and horseplay: Secondary | ICD-10-CM | POA: Insufficient documentation

## 2019-05-27 DIAGNOSIS — W19XXXA Unspecified fall, initial encounter: Secondary | ICD-10-CM

## 2019-05-27 DIAGNOSIS — J45909 Unspecified asthma, uncomplicated: Secondary | ICD-10-CM | POA: Diagnosis not present

## 2019-05-27 DIAGNOSIS — W0110XA Fall on same level from slipping, tripping and stumbling with subsequent striking against unspecified object, initial encounter: Secondary | ICD-10-CM | POA: Insufficient documentation

## 2019-05-27 DIAGNOSIS — I10 Essential (primary) hypertension: Secondary | ICD-10-CM | POA: Diagnosis not present

## 2019-05-27 DIAGNOSIS — E039 Hypothyroidism, unspecified: Secondary | ICD-10-CM | POA: Insufficient documentation

## 2019-05-27 DIAGNOSIS — Y999 Unspecified external cause status: Secondary | ICD-10-CM | POA: Insufficient documentation

## 2019-05-27 DIAGNOSIS — Y92007 Garden or yard of unspecified non-institutional (private) residence as the place of occurrence of the external cause: Secondary | ICD-10-CM | POA: Insufficient documentation

## 2019-05-27 DIAGNOSIS — S0990XA Unspecified injury of head, initial encounter: Secondary | ICD-10-CM | POA: Diagnosis present

## 2019-05-27 LAB — COMPREHENSIVE METABOLIC PANEL
ALT: 27 U/L (ref 0–44)
AST: 23 U/L (ref 15–41)
Albumin: 4.1 g/dL (ref 3.5–5.0)
Alkaline Phosphatase: 63 U/L (ref 38–126)
Anion gap: 12 (ref 5–15)
BUN: 15 mg/dL (ref 8–23)
CO2: 25 mmol/L (ref 22–32)
Calcium: 9 mg/dL (ref 8.9–10.3)
Chloride: 102 mmol/L (ref 98–111)
Creatinine, Ser: 0.58 mg/dL (ref 0.44–1.00)
GFR calc Af Amer: >60 mL/min (ref >60)
GFR calc non Af Amer: >60 mL/min (ref >60)
Glucose, Bld: 106 mg/dL — ABNORMAL HIGH (ref 70–99)
Potassium: 3.4 mmol/L — ABNORMAL LOW (ref 3.5–5.1)
Sodium: 139 mmol/L (ref 135–145)
Total Bilirubin: 1 mg/dL (ref 0.3–1.2)
Total Protein: 7.3 g/dL (ref 6.5–8.1)

## 2019-05-27 LAB — CBC WITH DIFFERENTIAL/PLATELET
Abs Immature Granulocytes: 0.04 10*3/uL (ref 0.00–0.07)
Basophils Absolute: 0.1 10*3/uL (ref 0.0–0.1)
Basophils Relative: 0 %
Eosinophils Absolute: 0.1 10*3/uL (ref 0.0–0.5)
Eosinophils Relative: 1 %
HCT: 43.4 % (ref 36.0–46.0)
Hemoglobin: 14.1 g/dL (ref 12.0–15.0)
Immature Granulocytes: 0 %
Lymphocytes Relative: 19 %
Lymphs Abs: 2.3 10*3/uL (ref 0.7–4.0)
MCH: 30.9 pg (ref 26.0–34.0)
MCHC: 32.5 g/dL (ref 30.0–36.0)
MCV: 95 fL (ref 80.0–100.0)
Monocytes Absolute: 0.5 10*3/uL (ref 0.1–1.0)
Monocytes Relative: 5 %
Neutro Abs: 8.9 10*3/uL — ABNORMAL HIGH (ref 1.7–7.7)
Neutrophils Relative %: 75 %
Platelets: 267 10*3/uL (ref 150–400)
RBC: 4.57 MIL/uL (ref 3.87–5.11)
RDW: 12.2 % (ref 11.5–15.5)
WBC: 11.8 10*3/uL — ABNORMAL HIGH (ref 4.0–10.5)
nRBC: 0 % (ref 0.0–0.2)

## 2019-05-27 MED ORDER — HYDROMORPHONE HCL 1 MG/ML IJ SOLN
1.0000 mg | Freq: Once | INTRAMUSCULAR | Status: AC
  Administered 2019-05-27: 1 mg via INTRAVENOUS
  Filled 2019-05-27: qty 1

## 2019-05-27 MED ORDER — HYDROCODONE-ACETAMINOPHEN 5-325 MG PO TABS: 1.0000 | ORAL_TABLET | Freq: Four times a day (QID) | ORAL | 0 refills | Status: DC | PRN

## 2019-05-27 MED ORDER — SODIUM CHLORIDE 0.9 % IV SOLN
INTRAVENOUS | Status: DC
  Administered 2019-05-27: 75 mL/h via INTRAVENOUS

## 2019-05-27 MED ORDER — ONDANSETRON 4 MG PO TBDP: 4.0000 mg | ORAL_TABLET | Freq: Three times a day (TID) | ORAL | 1 refills | Status: DC | PRN

## 2019-05-27 MED ORDER — HYDROMORPHONE HCL 1 MG/ML IJ SOLN
INTRAMUSCULAR | Status: AC
  Administered 2019-05-27: 1 mg
  Filled 2019-05-27: qty 1

## 2019-05-27 MED ORDER — ONDANSETRON HCL 4 MG/2ML IJ SOLN
4.0000 mg | Freq: Once | INTRAMUSCULAR | Status: AC
  Administered 2019-05-27: 4 mg via INTRAVENOUS
  Filled 2019-05-27: qty 2

## 2019-05-27 NOTE — ED Provider Notes (Addendum)
[facility] EMERGENCY DEPARTMENT Provider Note   CSN: [id number] Arrival date & time: 05/27/19  1000     History Chief Complaint  Patient presents with  . Headache    Olivia Arnold is a 68 y.o. female.  Patient status post a fall striking the back of her head on Christmas Eve and yard when playing with her dog.  Patient has pain to the back of the head since that time.  But got worse last night with a more severe headache.  Had some nausea and vomiting x2.  Patient blood pressure also has been elevated she does have a history of hypertension.  And is on Cozaar.  No other medication.  No visual changes.  No weakness or numbness in her upper extremities or lower extremities.  No speech problems.  No facial droop.  Patient's mother died from an aneurysm so patient was concerned.  However all symptoms started after the fall and striking the back of her head.        Past Medical History:  Diagnosis Date  . ADHD (attention deficit hyperactivity disorder)   . Anxiety   . Anxiety disorder, unspecified   . Asthma   . Chest pain, unspecified   . Depression   . Essential (primary) hypertension   . Essential hypertension   . History of cardiac catheterization    [facility] July 2000 - normal coronaries  . Hypothyroidism   . Hypothyroidism, unspecified   . Major depressive disorder, single episode, unspecified   . Rosacea, unspecified   . Unspecified asthma, uncomplicated     Patient Active Problem List   Diagnosis Date Noted  . Hypothyroidism, unspecified   . Major depressive disorder, single episode, unspecified   . Anxiety disorder, unspecified   . Chest pain, unspecified   . Essential (primary) hypertension   . Rosacea, unspecified   . Unspecified asthma, uncomplicated   . Hypothyroidism, adult 09/20/2014  . Major depression, single episode 09/20/2014  . Anxiety disorder 09/20/2014  . ADD (attention deficit disorder) 09/20/2014  . Asthma 09/20/2014    Past Surgical  History:  Procedure Laterality Date  . CATARACT EXTRACTION Bilateral 2018  . PARATHYROIDECTOMY    . THYROIDECTOMY    . TONSILLECTOMY       OB History   No obstetric history on file.     Family History  Problem Relation Age of Onset  . Heart attack Brother        Died age 23  . CAD Brother        Diagnosed age 63  . Aneurysm Mother        Brain  . Breast cancer Sister 61    Social History   Tobacco Use  . Smoking status: Former Smoker    Packs/day: 1.00    Types: Cigarettes    Start date: 09/20/1968  . Smokeless tobacco: Never Used  Substance Use Topics  . Alcohol use: Not Currently    Alcohol/week: 0.0 standard drinks  . Drug use: No    Home Medications Prior to Admission medications   Medication Sig Start Date End Date Taking? Authorizing Provider  cholecalciferol (VITAMIN D3) 25 MCG (1000 UT) tablet Take 1,000 Units by mouth daily.   Yes [provider]  FLUoxetine (PROZAC) 20 MG tablet Take 20 mg by mouth 2 (two) times daily.   Yes [provider]  folic acid (FOLVITE) 1 MG tablet Take 1 mg by mouth daily.   Yes [provider]  levothyroxine (SYNTHROID, [place])  100 MCG tablet Take 100 mcg by mouth daily before breakfast.   Yes [provider]  losartan (COZAAR) 100 MG tablet Take 100 mg by mouth daily.   Yes [provider]  montelukast (SINGULAIR) 10 MG tablet Take 10 mg by mouth at bedtime.   Yes [provider]  PREMARIN vaginal cream Place 1 g vaginally 2 (two) times a week. 05/14/19  Yes [provider]  vitamin B-12 (CYANOCOBALAMIN) 500 MCG tablet Take 500 mcg by mouth daily.   Yes [provider]  doxycycline (VIBRAMYCIN) 100 MG capsule Take 1 capsule (100 mg total) by mouth 2 (two) times daily. 02/01/18   Triplett, Tammy, PA-C  HYDROcodone-acetaminophen (NORCO/VICODIN) 5-325 MG tablet Take 1 tablet by mouth every 6 (six) hours as needed. 05/27/19   Fredia Sorrow, MD  mupirocin  ointment (BACTROBAN) 2 % Apply to the affected area 3 times a day for 10 days Patient not taking: Reported on 05/27/2019 02/01/18   Triplett, Tammy, PA-C  ondansetron (ZOFRAN ODT) 4 MG disintegrating tablet Take 1 tablet (4 mg total) by mouth every 8 (eight) hours as needed. 05/27/19   Fredia Sorrow, MD    Allergies    Patient has no known allergies.  Review of Systems   Review of Systems  Constitutional: Negative for chills and fever.  HENT: Negative for congestion, rhinorrhea and sore throat.   Eyes: Negative for photophobia and visual disturbance.  Respiratory: Negative for cough and shortness of breath.   Cardiovascular: Negative for chest pain and leg swelling.  Gastrointestinal: Positive for nausea and vomiting. Negative for abdominal pain and diarrhea.  Genitourinary: Negative for dysuria.  Musculoskeletal: Positive for neck pain. Negative for back pain.  Skin: Negative for rash.  Neurological: Positive for headaches. Negative for dizziness and light-headedness.  Hematological: Does not bruise/bleed easily.  Psychiatric/Behavioral: Negative for confusion.    Physical Exam Updated Vital Signs BP (!) 166/69   Pulse 70   Temp 98.1 F (36.7 C) (Oral)   Resp 13   Ht 1.626 m (5\' 4" )   Wt 68 kg   SpO2 95%   BMI 25.75 kg/m   Physical Exam Vitals and nursing note reviewed.  Constitutional:      General: She is not in acute distress.    Appearance: Normal appearance. She is well-developed.  HENT:     Head: Normocephalic and atraumatic.  Eyes:     Extraocular Movements: Extraocular movements intact.     Conjunctiva/sclera: Conjunctivae normal.     Pupils: Pupils are equal, round, and reactive to light.  Cardiovascular:     Rate and Rhythm: Normal rate and regular rhythm.     Heart sounds: No murmur.  Pulmonary:     Effort: Pulmonary effort is normal. No respiratory distress.     Breath sounds: Normal breath sounds.  Abdominal:     Palpations: Abdomen is soft.      Tenderness: There is no abdominal tenderness.  Musculoskeletal:        General: Normal range of motion.     Cervical back: Normal range of motion and neck supple.  Skin:    General: Skin is warm and dry.     Capillary Refill: Capillary refill takes less than 2 seconds.  Neurological:     Mental Status: She is alert.     Cranial Nerves: No cranial nerve deficit.     Sensory: No sensory deficit.     Motor: No weakness.     Coordination: Coordination normal.  ED Results / Procedures / Treatments   Labs (all labs ordered are listed, but only abnormal results are displayed) Labs Reviewed  COMPREHENSIVE METABOLIC PANEL - Abnormal; Notable for the following components:      Result Value   Potassium 3.4 (*)    Glucose, Bld 106 (*)    All other components within normal limits  CBC WITH DIFFERENTIAL/PLATELET - Abnormal; Notable for the following components:   WBC 11.8 (*)    Neutro Abs 8.9 (*)    All other components within normal limits    EKG EKG Interpretation  Date/Time:  Thursday May 27 2019 10:28:47 EST Ventricular Rate:  83 PR Interval:    QRS Duration: 89 QT Interval:  399 QTC Calculation: 469 R Axis:   56 Text Interpretation: Sinus rhythm Probable left atrial enlargement No previous ECGs available Confirmed by Fredia Sorrow [phone]) on 05/27/2019 10:49:45 AM   Radiology CT Head Wo Contrast  Result Date: 05/27/2019 CLINICAL DATA:  Headache, posttraumatic, moderate/severe. Polytrauma, critical, head/cervical spine injury suspected. Additional history provided: Patient reports fall on Christmas day hitting back of head, now with headache and difficulty concentrating. EXAM: CT HEAD WITHOUT CONTRAST CT CERVICAL SPINE WITHOUT CONTRAST TECHNIQUE: Multidetector CT imaging of the head and cervical spine was performed following the standard protocol without intravenous contrast. Multiplanar CT image reconstructions of the cervical spine were also generated. COMPARISON:   Report from MRI/MRA head 07/22/2002 (images unavailable) FINDINGS: CT HEAD FINDINGS Brain: No evidence of acute intracranial hemorrhage. No demarcated cortical infarction. No evidence of intracranial mass. No midline shift or extra-axial fluid collection. Minimal ill-defined hypoattenuation within the cerebral white matter is nonspecific, but consistent with chronic small vessel ischemic disease Cerebral volume is normal for age. Vascular: No hyperdense vessel. Atherosclerotic calcifications. Skull: Normal. Negative for fracture or focal lesion. Sinuses/Orbits: Visualized orbits demonstrate no acute abnormality. No significant paranasal sinus disease or mastoid effusion at the imaged levels CT CERVICAL SPINE FINDINGS Alignment: Straightening of the expected cervical lordosis. No significant spondylolisthesis. Skull base and vertebrae: The basion-dental and atlanto-dental intervals are maintained.No evidence of acute fracture to the cervical spine. 2 mm sclerotic lesion within the left C5 lateral mass, nonspecific in isolation and possibly reflecting a tiny bone island. Soft tissues and spinal canal: No prevertebral fluid or swelling. No visible canal hematoma. Sequela of prior left hemithyroidectomy. Disc levels: No significant bony spinal canal or neural foraminal narrowing at any level. Upper chest: No consolidation within the imaged lung apices. No visible pneumothorax. IMPRESSION: CT head: 1. No evidence of acute intracranial abnormality. 2. Minimal chronic small vessel ischemic disease. CT cervical spine: No evidence of acute fracture to the cervical spine. Electronically Signed   By: Kellie Simmering DO   On: 05/27/2019 11:55   CT Cervical Spine Wo Contrast  Result Date: 05/27/2019 CLINICAL DATA:  Headache, posttraumatic, moderate/severe. Polytrauma, critical, head/cervical spine injury suspected. Additional history provided: Patient reports fall on Christmas day hitting back of head, now with headache and  difficulty concentrating. EXAM: CT HEAD WITHOUT CONTRAST CT CERVICAL SPINE WITHOUT CONTRAST TECHNIQUE: Multidetector CT imaging of the head and cervical spine was performed following the standard protocol without intravenous contrast. Multiplanar CT image reconstructions of the cervical spine were also generated. COMPARISON:  Report from MRI/MRA head 07/22/2002 (images unavailable) FINDINGS: CT HEAD FINDINGS Brain: No evidence of acute intracranial hemorrhage. No demarcated cortical infarction. No evidence of intracranial mass. No midline shift or extra-axial fluid collection. Minimal ill-defined hypoattenuation within the cerebral white matter is nonspecific, but  consistent with chronic small vessel ischemic disease Cerebral volume is normal for age. Vascular: No hyperdense vessel. Atherosclerotic calcifications. Skull: Normal. Negative for fracture or focal lesion. Sinuses/Orbits: Visualized orbits demonstrate no acute abnormality. No significant paranasal sinus disease or mastoid effusion at the imaged levels CT CERVICAL SPINE FINDINGS Alignment: Straightening of the expected cervical lordosis. No significant spondylolisthesis. Skull base and vertebrae: The basion-dental and atlanto-dental intervals are maintained.No evidence of acute fracture to the cervical spine. 2 mm sclerotic lesion within the left C5 lateral mass, nonspecific in isolation and possibly reflecting a tiny bone island. Soft tissues and spinal canal: No prevertebral fluid or swelling. No visible canal hematoma. Sequela of prior left hemithyroidectomy. Disc levels: No significant bony spinal canal or neural foraminal narrowing at any level. Upper chest: No consolidation within the imaged lung apices. No visible pneumothorax. IMPRESSION: CT head: 1. No evidence of acute intracranial abnormality. 2. Minimal chronic small vessel ischemic disease. CT cervical spine: No evidence of acute fracture to the cervical spine. Electronically Signed   By: Kellie Simmering DO   On: 05/27/2019 11:55    Procedures Procedures (including critical care time)  Medications Ordered in ED Medications  0.9 %  sodium chloride infusion (75 mL/hr Intravenous New Bag/Given 05/27/19 1158)  ondansetron (ZOFRAN) injection 4 mg (4 mg Intravenous Given 05/27/19 1159)  HYDROmorphone (DILAUDID) injection 1 mg (1 mg Intravenous Given 05/27/19 1200)  HYDROmorphone (DILAUDID) 1 MG/ML injection (1 mg  Given 05/27/19 1211)    ED Course  I have reviewed the triage vital signs and the nursing notes.  Pertinent labs & imaging results that were available during my care of the patient were reviewed by me and considered in my medical decision making (see chart for details).    MDM Rules/Calculators/A&P                       Patient headache resolved with hydromorphone.  We will treat her at home with hydrocodone and Zofran.  CT head and neck without any acute findings.  Patient symptom complex is consistent with a concussion.  From the fall that occurred on Christmas Eve.  When she struck the back of her head.  This also where her pain is.  Blood pressure was elevated and improved some with the pain medication but still had a systolic of [id number].  She will need to follow-up with her primary care doctor for this.  Patient does have a history of hypertension and is on Cozaar.  However blood pressures are running a little high although improved with the pain medicine.  Patient nontoxic no acute distress.  Patient symptoms started after the fall setting is more consistent with a concussion postconcussive syndrome.  Aneurysm not completely ruled out since CT angio not done.  But no evidence of any blood on the brain.  I think clinically it is unlikely that the pain is secondary to an aneurysm.   Final Clinical Impression(s) / ED Diagnoses Final diagnoses:  Fall, initial encounter  Concussion without loss of consciousness, initial encounter  Essential hypertension    Rx / DC  Orders ED Discharge Orders         Ordered    HYDROcodone-acetaminophen (NORCO/VICODIN) 5-325 MG tablet  Every 6 hours PRN     05/27/19 1403    ondansetron (ZOFRAN ODT) 4 MG disintegrating tablet  Every 8 hours PRN     05/27/19 1403           Dennis Killilea, [place],  MD 05/27/19 1408    Fredia Sorrow, MD 05/27/19 1409

## 2019-05-27 NOTE — ED Notes (Signed)
Pt reports understanding of discharge instructions, follow up care plan, and medication regimen, pt reports having a chance to have her questions answered

## 2019-05-27 NOTE — ED Triage Notes (Signed)
Patient complains of headache. States she fell Christmas day and hit her head states headache started day later. States pain is throbbing now and can not concentrate. She states mother died of aneurism.

## 2019-05-27 NOTE — Discharge Instructions (Signed)
CT head and neck without any acute findings.  But symptoms are consistent with a concussion postconcussion headache.  Take the hydrocodone and Zofran as needed.  Zofran is for nausea and vomiting.  If symptoms persist for another week would recommend following up with your regular doctor they may want to do an MRI of sometimes concussion symptoms can last for a long period of time.  Also blood pressure improved here with pain control but you need to have your blood pressure followed up as well.

## 2019-05-27 NOTE — ED Notes (Signed)
Pt given emesis bag d/t one episode of emesis post Dilaudid admin, pt given cool wash cloth, will continue to monitor

## 2019-05-31 ENCOUNTER — Telehealth: Payer: Self-pay | Admitting: Family Medicine

## 2019-05-31 NOTE — Telephone Encounter (Signed)
Please let pt know , since she is not currently a patient in our practice we can not give medical advice. UC and ER are available in [place] for medical evaluation.

## 2019-05-31 NOTE — Telephone Encounter (Signed)
Patient is a former patient of Dr. Standley Dakins and she came into the office today and states she fell on 05/20/19 and started vomiting on 05/25/19. She had a horrible headache on 05/26/19. Patient states she went to the ER on 12/31  And they did a CT, patient BP was 222/100. She has had confusion but no bleeding. She has been taken losarten patient BP was 166/88 today. Patient has a new pt appt on 06/30/19 and would like to know if she needs to concerned at this point, or what she needs to do before her appt./

## 2019-05-31 NOTE — Telephone Encounter (Signed)
Routing to Dr. Corum 

## 2019-06-01 ENCOUNTER — Other Ambulatory Visit: Payer: Self-pay

## 2019-06-01 ENCOUNTER — Ambulatory Visit
Admission: EM | Admit: 2019-06-01 | Discharge: 2019-06-01 | Disposition: A | Discharge status: Home / Self Care | Payer: Medicare PPO

## 2019-06-01 DIAGNOSIS — I10 Essential (primary) hypertension: Secondary | ICD-10-CM

## 2019-06-01 DIAGNOSIS — R030 Elevated blood-pressure reading, without diagnosis of hypertension: Secondary | ICD-10-CM

## 2019-06-01 NOTE — Telephone Encounter (Signed)
Patient called and left a voicemail I tried to call patient back with the information below and was unable to leave voicemail.

## 2019-06-01 NOTE — ED Provider Notes (Signed)
[place]   [id number] 06/01/19 Arrival Time: [id number]  CC: HTN  SUBJECTIVE:  Olivia Arnold is a 69 y.o. female who presents for elevated blood pressure for the past couple of weeks.  Hx of high blood pressure for several years.  Reports recent fall on 12/24 and diagnosed with concussion in the ED on 12/31.  Had a negative CT scan.  States since then blood pressure has been elevated.  States blood pressure on average has been 160-170/80.  Takes losartan 100 mg daily in the evening.  Her PCP, Dr. Luan Pulling, retired and now has appt with Dr. Holly Bodily on 06/30/2019.  Reports associated mild dull headaches. However, this may related to concussion.  Denies vision changes, dizziness, lightheadedness, chest pain, shortness of breath, numbness or tingling in extremities, abdominal pain, changes in bowel or bladder habits, slurred speech, weakness in arms or legs, facial droop.    ROS: As per HPI.  All other pertinent ROS negative.     Past Medical History:  Diagnosis Date  . ADHD (attention deficit hyperactivity disorder)   . Anxiety   . Anxiety disorder, unspecified   . Asthma   . Chest pain, unspecified   . Depression   . Essential (primary) hypertension   . Essential hypertension   . History of cardiac catheterization    [facility] July 2000 - normal coronaries  . Hypothyroidism   . Hypothyroidism, unspecified   . Major depressive disorder, single episode, unspecified   . Rosacea, unspecified   . Unspecified asthma, uncomplicated    Past Surgical History:  Procedure Laterality Date  . CATARACT EXTRACTION Bilateral 2018  . PARATHYROIDECTOMY    . THYROIDECTOMY    . TONSILLECTOMY     No Known Allergies No current facility-administered medications on file prior to encounter.   Current Outpatient Medications on File Prior to Encounter  Medication Sig Dispense Refill  . cholecalciferol (VITAMIN D3) 25 MCG (1000 UT) tablet Take 1,000 Units by mouth daily.    Marland Kitchen doxycycline (VIBRAMYCIN) 100  MG capsule Take 1 capsule (100 mg total) by mouth 2 (two) times daily. 20 capsule 0  . FLUoxetine (PROZAC) 20 MG tablet Take 20 mg by mouth 2 (two) times daily.    . folic acid (FOLVITE) 1 MG tablet Take 1 mg by mouth daily.    Marland Kitchen HYDROcodone-acetaminophen (NORCO/VICODIN) 5-325 MG tablet Take 1 tablet by mouth every 6 (six) hours as needed. 14 tablet 0  . levothyroxine (SYNTHROID, LEVOTHROID) 100 MCG tablet Take 100 mcg by mouth daily before breakfast.    . losartan (COZAAR) 100 MG tablet Take 100 mg by mouth daily.    . montelukast (SINGULAIR) 10 MG tablet Take 10 mg by mouth at bedtime.    . mupirocin ointment (BACTROBAN) 2 % Apply to the affected area 3 times a day for 10 days (Patient not taking: Reported on 05/27/2019) 22 g 0  . ondansetron (ZOFRAN ODT) 4 MG disintegrating tablet Take 1 tablet (4 mg total) by mouth every 8 (eight) hours as needed. 10 tablet 1  . PREMARIN vaginal cream Place 1 g vaginally 2 (two) times a week.    . vitamin B-12 (CYANOCOBALAMIN) 500 MCG tablet Take 500 mcg by mouth daily.     Social History   Socioeconomic History  . Marital status: Married    Spouse name: Not on file  . Number of children: Not on file  . Years of education: Not on file  . Highest education level: Not on file  Occupational  History  . Not on file  Tobacco Use  . Smoking status: Former Smoker    Packs/day: 1.00    Types: Cigarettes    Start date: 09/20/1968  . Smokeless tobacco: Never Used  Substance and Sexual Activity  . Alcohol use: Not Currently    Alcohol/week: 0.0 standard drinks  . Drug use: No  . Sexual activity: Not on file  Other Topics Concern  . Not on file  Social History Narrative  . Not on file   Social Determinants of Health   Financial Resource Strain:   . Difficulty of Paying Living Expenses: Not on file  Food Insecurity:   . Worried About Charity fundraiser in the Last Year: Not on file  . Ran Out of Food in the Last Year: Not on file  Transportation  Needs:   . Lack of Transportation (Medical): Not on file  . Lack of Transportation (Non-Medical): Not on file  Physical Activity:   . Days of Exercise per Week: Not on file  . Minutes of Exercise per Session: Not on file  Stress:   . Feeling of Stress : Not on file  Social Connections:   . Frequency of Communication with Friends and Family: Not on file  . Frequency of Social Gatherings with Friends and Family: Not on file  . Attends Religious Services: Not on file  . Active Member of Clubs or Organizations: Not on file  . Attends Archivist Meetings: Not on file  . Marital Status: Not on file  Intimate Partner Violence:   . Fear of Current or Ex-Partner: Not on file  . Emotionally Abused: Not on file  . Physically Abused: Not on file  . Sexually Abused: Not on file   Family History  Problem Relation Age of Onset  . Heart attack Brother        Died age 65  . CAD Brother        Diagnosed age 9  . Aneurysm Mother        Brain  . Breast cancer Sister 54    OBJECTIVE:  Vitals:   06/01/19 1410  BP: (!) 178/78  Pulse: 76  Resp: 16  Temp: 98.5 F (36.9 C)  TempSrc: Oral  SpO2: 95%    General appearance: alert; no distress Eyes: PERRLA; EOMI HENT: normocephalic; atraumatic; oropharynx clear Neck: supple with FROM Lungs: clear to auscultation bilaterally Heart: regular rate and rhythm.   Extremities: no edema; symmetrical with no gross deformities Skin: warm and dry Neurologic: ambulates without difficulty Psychological: alert and cooperative; normal mood and affect   ASSESSMENT & PLAN:  1. Elevated blood pressure reading   2. Essential hypertension    Blood pressure slightly elevated, but not emergent at this time Please continue to monitor blood pressure at home and keep a log Eat a well balanced diet of fruits, vegetables and lean meats.  Avoid foods high in fat and salt Drink water.  At least half your body weight in ounces Take losartan as  prescribed.  We will defer any changes to PCP Follow up with Dr. Holly Bodily for further evaluation and management Return or go to the ED if you have any new or worsening symptoms such as vision changes, fatigue, dizziness, chest pain, shortness of breath, nausea, swelling in your hands or feet, urinary symptoms, etc...  Reviewed expectations re: course of current medical issues. Questions answered. Outlined signs and symptoms indicating need for more acute intervention. Patient verbalized understanding. After Visit Summary  given.   Lestine Box, PA-C 06/01/19 1513

## 2019-06-01 NOTE — Discharge Instructions (Signed)
Blood pressure slightly elevated, but not emergent at this time Please continue to monitor blood pressure at home and keep a log Eat a well balanced diet of fruits, vegetables and lean meats.  Avoid foods high in fat and salt Drink water.  At least half your body weight in ounces Take losartan as prescribed.  We will defer any changes to PCP Follow up with Dr. Holly Bodily for further evaluation and management Return or go to the ED if you have any new or worsening symptoms such as vision changes, fatigue, dizziness, chest pain, shortness of breath, nausea, swelling in your hands or feet, urinary symptoms, etc..Marland Kitchen

## 2019-06-01 NOTE — ED Triage Notes (Signed)
Pt presents to UC w/ c/o hypertension since concussion x almost 2 weeks ago. Pt states she was seen in the emergency department and received treatment. Pt would like to know how to manage hypertension until seen by new PCP on 06/30/19

## 2019-06-30 ENCOUNTER — Ambulatory Visit (INDEPENDENT_AMBULATORY_CARE_PROVIDER_SITE_OTHER): Payer: Medicare PPO | Admitting: Family Medicine

## 2019-06-30 ENCOUNTER — Other Ambulatory Visit: Payer: Self-pay

## 2019-06-30 ENCOUNTER — Encounter: Payer: Self-pay | Admitting: Family Medicine

## 2019-06-30 VITALS — BP 154/72 | HR 89 | Temp 97.9°F | Ht 64.0 in | Wt 177.6 lb

## 2019-06-30 DIAGNOSIS — T7840XS Allergy, unspecified, sequela: Secondary | ICD-10-CM

## 2019-06-30 DIAGNOSIS — F321 Major depressive disorder, single episode, moderate: Secondary | ICD-10-CM

## 2019-06-30 DIAGNOSIS — E039 Hypothyroidism, unspecified: Secondary | ICD-10-CM

## 2019-06-30 DIAGNOSIS — I10 Essential (primary) hypertension: Secondary | ICD-10-CM

## 2019-06-30 DIAGNOSIS — T7840XA Allergy, unspecified, initial encounter: Secondary | ICD-10-CM | POA: Insufficient documentation

## 2019-06-30 MED ORDER — PREMARIN 0.625 MG/GM VA CREA: 1.0000 g | TOPICAL_CREAM | VAGINAL | 11 refills | Status: AC

## 2019-06-30 MED ORDER — LOSARTAN POTASSIUM 100 MG PO TABS: 100.0000 mg | ORAL_TABLET | Freq: Every day | ORAL | 1 refills | Status: AC

## 2019-06-30 MED ORDER — LEVOTHYROXINE SODIUM 100 MCG PO TABS: 100.0000 ug | ORAL_TABLET | Freq: Every day | ORAL | 3 refills | Status: DC

## 2019-06-30 MED ORDER — MONTELUKAST SODIUM 10 MG PO TABS: 10.0000 mg | ORAL_TABLET | Freq: Every day | ORAL | 3 refills | Status: DC

## 2019-06-30 MED ORDER — FLUOXETINE HCL 40 MG PO CAPS: 40.0000 mg | ORAL_CAPSULE | Freq: Every day | ORAL | 1 refills | Status: DC

## 2019-06-30 NOTE — Progress Notes (Signed)
New Patient Office Visit  Subjective:  Patient ID: Olivia Arnold, female    DOB: Aug 25, 1950  Age: 69 y.o. MRN: [id number]  CC:  Chief Complaint  Patient presents with  . Establish Care  Hypothyroid-synthroid 168mcg HTN-Losartan 100mg   AR-singulair HPI Olivia Arnold presents for fell on 12/25-slipped on incline Patient headache resolved with hydromorphone.  We will treat her at home with hydrocodone and Zofran.  CT head and neck without any acute findings.  Patient symptom complex is consistent with a concussion.  From the fall that occurred on Christmas Eve.  When she struck the back of her head.  This also where her pain is.  Blood pressure was elevated and improved some with the pain medication but still had a systolic of [id number].  She will need to follow-up with her primary care doctor for this.  Patient does have a history of hypertension and is on Cozaar.  However blood pressures are running a little high although improved with the pain medicine.Patient nontoxic no acute distress. Patient symptoms started after the fall setting is more consistent with a concussion postconcussive syndrome.  Aneurysm not completely ruled out since CT angio not done.  But no evidence of any blood on the brain.  I think clinically it is unlikely that the pain is secondary to an aneurysm. UC-ASSESSMENT & PLAN:  1. Elevated blood pressure reading   2. Essential hypertension   Blood pressure slightly elevated, but not emergent at this time Please continue to monitor blood pressure at home and keep a log Eat a well balanced diet of fruits, vegetables and lean meats.  Avoid foods high in fat and salt Drink water.  At least half your body weight in ounces Take losartan as prescribed.  We will defer any changes to PCP Follow up with Dr. Holly Arnold for further evaluation and management  Hypothyroid-synthroid 133mcg-TSH AR-singulair Vit D deficiency-1000IU Pt on Cymbalta in the past-stopped taking in Dec and  switched to Prozac-started by Dr. Luan Pulling with increase slowly from 20 to now 40mg  Past Medical History:  Diagnosis Date  . ADHD (attention deficit hyperactivity disorder)   . Anxiety   . Anxiety disorder, unspecified   . Asthma   . Chest pain, unspecified   . Depression   . Essential (primary) hypertension   . Essential hypertension   . History of cardiac catheterization    [facility] July 2000 - normal coronaries  . Hypothyroidism   . Hypothyroidism, unspecified   . Major depressive disorder, single episode, unspecified   . Rosacea, unspecified   . Unspecified asthma, uncomplicated     Past Surgical History:  Procedure Laterality Date  . CATARACT EXTRACTION Bilateral 2018  . PARATHYROIDECTOMY    . THYROIDECTOMY    . TONSILLECTOMY      Family History  Problem Relation Age of Onset  . Heart attack Brother        Died age 83  . CAD Brother        Diagnosed age 53  . Aneurysm Mother        Brain  . Breast cancer Sister 17    Social History   Socioeconomic History  . Marital status: Married    Spouse name: Not on file  . Number of children: Not on file  . Years of education: Not on file  . Highest education level: Not on file  Occupational History  . Not on file  Tobacco Use  . Smoking status: Former Smoker    Packs/day:  1.00    Types: Cigarettes    Start date: 09/20/1968  . Smokeless tobacco: Never Used  Substance and Sexual Activity  . Alcohol use: Not Currently    Alcohol/week: 0.0 standard drinks  . Drug use: No  . Sexual activity: Not on file  Other Topics Concern  . Not on file  Social History Narrative  . Not on file   Social Determinants of Health   Financial Resource Strain:   . Difficulty of Paying Living Expenses: Not on file  Food Insecurity:   . Worried About Charity fundraiser in the Last Year: Not on file  . Ran Out of Food in the Last Year: Not on file  Transportation Needs:   . Lack of Transportation (Medical): Not on file  . Lack of  Transportation (Non-Medical): Not on file  Physical Activity:   . Days of Exercise per Week: Not on file  . Minutes of Exercise per Session: Not on file  Stress:   . Feeling of Stress : Not on file  Social Connections:   . Frequency of Communication with Friends and Family: Not on file  . Frequency of Social Gatherings with Friends and Family: Not on file  . Attends Religious Services: Not on file  . Active Member of Clubs or Organizations: Not on file  . Attends Archivist Meetings: Not on file  . Marital Status: Not on file  Intimate Partner Violence:   . Fear of Current or Ex-Partner: Not on file  . Emotionally Abused: Not on file  . Physically Abused: Not on file  . Sexually Abused: Not on file    ROS Review of Systems  Constitutional: Negative.   HENT: Negative.   Respiratory: Negative.   Cardiovascular: Negative.   Gastrointestinal: Negative.   Endocrine: Negative.   Genitourinary: Negative.   Musculoskeletal: Negative.   Skin: Negative.   Allergic/Immunologic: Negative.   Hematological: Negative.   Psychiatric/Behavioral: Positive for dysphoric mood. The patient is nervous/anxious.     Objective:   Today's Vitals: BP (!) 154/72 (BP Location: Left Arm, Patient Position: Sitting, Cuff Size: Normal)   Pulse 89   Temp 97.9 F (36.6 C) (Oral)   Ht 5\' 4"  (1.626 m)   Wt 177 lb 9.6 oz (80.6 kg)   SpO2 97%   BMI 30.48 kg/m   Physical Exam Constitutional:      Appearance: Normal appearance.  HENT:     Head: Normocephalic and atraumatic.  Cardiovascular:     Rate and Rhythm: Normal rate and regular rhythm.     Pulses: Normal pulses.     Heart sounds: Normal heart sounds.  Pulmonary:     Breath sounds: Normal breath sounds.  Musculoskeletal:     Cervical back: Normal range of motion and neck supple.  Neurological:     Mental Status: She is alert and oriented to person, place, and time.  Psychiatric:        Mood and Affect: Mood normal.         Behavior: Behavior normal.     Assessment & Plan:  1. Hypothyroidism, adult Synthroid-rx, TSH-.51  2. Current moderate episode of major depressive disorder without prior episode (HCC) Continue prozac 40mg  daily  3. Essential (primary) hypertension Cozaar daily-checking blood pressure daily in the morning-d/w pt HCTZ if needed 4. Allergy, sequela singulair-rx Outpatient Encounter Medications as of 06/30/2019  Medication Sig  . cholecalciferol (VITAMIN D3) 25 MCG (1000 UT) tablet Take 1,000 Units by mouth daily.  Marland Kitchen  FLUoxetine (PROZAC) 20 MG tablet Take 20 mg by mouth 2 (two) times daily.  . folic acid (FOLVITE) 1 MG tablet Take 400 mg by mouth daily.   Marland Kitchen levothyroxine (SYNTHROID, LEVOTHROID) 100 MCG tablet Take 100 mcg by mouth daily before breakfast.  . losartan (COZAAR) 100 MG tablet Take 100 mg by mouth daily.  . montelukast (SINGULAIR) 10 MG tablet Take 10 mg by mouth at bedtime.  Marland Kitchen PREMARIN vaginal cream Place 1 g vaginally 2 (two) times a week.  . vitamin B-12 (CYANOCOBALAMIN) 500 MCG tablet Take 500 mcg by mouth daily.  Marland Kitchen doxycycline (VIBRAMYCIN) 100 MG capsule Take 1 capsule (100 mg total) by mouth 2 (two) times daily.  Marland Kitchen HYDROcodone-acetaminophen (NORCO/VICODIN) 5-325 MG tablet Take 1 tablet by mouth every 6 (six) hours as needed.  . mupirocin ointment (BACTROBAN) 2 % Apply to the affected area 3 times a day for 10 days (Patient not taking: Reported on 05/27/2019)  . ondansetron (ZOFRAN ODT) 4 MG disintegrating tablet Take 1 tablet (4 mg total) by mouth every 8 (eight) hours as needed.   No facility-administered encounter medications on file as of 06/30/2019.    Follow-up: 2 weeks-blood pressure   Olivine Hiers Hannah Beat, MD

## 2019-06-30 NOTE — Patient Instructions (Addendum)
COVID-19 Vaccine Information can be found at: ShippingScam.co.uk For questions related to vaccine distribution or appointments, please email [email or web address] or call [phone].   Check blood pressure first thing in the morning and record     Preventing Hypertension Hypertension, commonly called high blood pressure, is when the force of blood pumping through the arteries is too strong. Arteries are blood vessels that carry blood from the heart throughout the body. Over time, hypertension can damage the arteries and decrease blood flow to important parts of the body, including the brain, heart, and kidneys. Often, hypertension does not cause symptoms until blood pressure is very high. For this reason, it is important to have your blood pressure checked on a regular basis. Hypertension can often be prevented with diet and lifestyle changes. If you already have hypertension, you can control it with diet and lifestyle changes, as well as medicine. What nutrition changes can be made? Maintain a healthy diet. This includes:  Eating less salt (sodium). Ask your health care provider how much sodium is safe for you to have. The general recommendation is to consume less than 1 tsp (2,300 mg) of sodium a day. ? Do not add salt to your food. ? Choose low-sodium options when grocery shopping and eating out.  Limiting fats in your diet. You can do this by eating low-fat or fat-free dairy products and by eating less red meat.  Eating more fruits, vegetables, and whole grains. Make a goal to eat: ? 1-2 cups of fresh fruits and vegetables each day. ? 3-4 servings of whole grains each day.  Avoiding foods and beverages that have added sugars.  Eating fish that contain healthy fats (omega-3 fatty acids), such as mackerel or salmon. If you need help putting together a healthy eating plan, try the DASH diet. This diet is high in fruits,  vegetables, and whole grains. It is low in sodium, red meat, and added sugars. DASH stands for Dietary Approaches to Stop Hypertension. What lifestyle changes can be made?   Lose weight if you are overweight. Losing just 3?5% of your body weight can help prevent or control hypertension. ? For example, if your present weight is 200 lb (91 kg), a loss of 3-5% of your weight means losing 6-10 lb (2.7-4.5 kg). ? Ask your health care provider to help you with a diet and exercise plan to safely lose weight.  Get enough exercise. Do at least 150 minutes of moderate-intensity exercise each week. ? You could do this in short exercise sessions several times a day, or you could do longer exercise sessions a few times a week. For example, you could take a brisk 10-minute walk or bike ride, 3 times a day, for 5 days a week.  Find ways to reduce stress, such as exercising, meditating, listening to music, or taking a yoga class. If you need help reducing stress, ask your health care provider.  Do not smoke. This includes e-cigarettes. Chemicals in tobacco and nicotine products raise your blood pressure each time you smoke. If you need help quitting, ask your health care provider.  Avoid alcohol. If you drink alcohol, limit alcohol intake to no more than 1 drink a day for nonpregnant women and 2 drinks a day for men. One drink equals 12 oz of beer, 5 oz of wine, or 1 oz of hard liquor. Why are these changes important? Diet and lifestyle changes can help you prevent hypertension, and they may make you feel better overall and improve your quality of  life. If you have hypertension, making these changes will help you control it and help prevent major complications, such as:  Hardening and narrowing of arteries that supply blood to: ? Your heart. This can cause a heart attack. ? Your brain. This can cause a stroke. ? Your kidneys. This can cause kidney failure.  Stress on your heart muscle, which can cause heart  failure. What can I do to lower my risk?  Work with your health care provider to make a hypertension prevention plan that works for you. Follow your plan and keep all follow-up visits as told by your health care provider.  Learn how to check your blood pressure at home. Make sure that you know your personal target blood pressure, as told by your health care provider. How is this treated? In addition to diet and lifestyle changes, your health care provider may recommend medicines to help lower your blood pressure. You may need to try a few different medicines to find what works best for you. You also may need to take more than one medicine. Take over-the-counter and prescription medicines only as told by your health care provider. Where to find support Your health care provider can help you prevent hypertension and help you keep your blood pressure at a healthy level. Your local hospital or your community may also provide support services and prevention programs. The American Heart Association offers an online support network at: CheapBootlegs.com.cy Where to find more information Learn more about hypertension from:  [place], Lung, and Blood Institute: ElectronicHangman.is  Centers for Disease Control and Prevention: https://ingram.com/  American Academy of Family Physicians: http://familydoctor.org/familydoctor/en/diseases-conditions/high-blood-pressure.printerview.all.html Learn more about the DASH diet from:  [place], Lung, and [place]: https://www.reyes.com/ Contact a health care provider if:  You think you are having a reaction to medicines you have taken.  You have recurrent headaches or feel dizzy.  You have swelling in your ankles.  You have trouble with your vision. Summary  Hypertension often does not cause any symptoms until blood pressure is very high. It is  important to get your blood pressure checked regularly.  Diet and lifestyle changes are the most important steps in preventing hypertension.  By keeping your blood pressure in a healthy range, you can prevent complications like heart attack, heart failure, stroke, and kidney failure.  Work with your health care provider to make a hypertension prevention plan that works for you. This information is not intended to replace advice given to you by your health care provider. Make sure you discuss any questions you have with your health care provider. Document Revised: 09/04/2018 Document Reviewed: 01/22/2016 Elsevier Patient Education  2020 [employer].

## 2019-07-14 ENCOUNTER — Telehealth (INDEPENDENT_AMBULATORY_CARE_PROVIDER_SITE_OTHER): Payer: Medicare PPO | Admitting: Family Medicine

## 2019-07-14 ENCOUNTER — Other Ambulatory Visit: Payer: Self-pay

## 2019-07-14 ENCOUNTER — Encounter: Payer: Self-pay | Admitting: Family Medicine

## 2019-07-14 VITALS — BP 172/89 | HR 76 | Ht 64.0 in | Wt 177.0 lb

## 2019-07-14 DIAGNOSIS — I10 Essential (primary) hypertension: Secondary | ICD-10-CM

## 2019-07-14 MED ORDER — POTASSIUM CHLORIDE CRYS ER 20 MEQ PO TBCR: 20.0000 meq | EXTENDED_RELEASE_TABLET | Freq: Every day | ORAL | 3 refills | Status: DC

## 2019-07-14 MED ORDER — HYDROCHLOROTHIAZIDE 12.5 MG PO CAPS: 12.5000 mg | ORAL_CAPSULE | Freq: Every day | ORAL | 0 refills | Status: DC

## 2019-07-14 NOTE — Progress Notes (Signed)
Virtual Visit via Telephone Note  I connected with Olivia Arnold on 07/14/19 at 5:10pm by telephone and verified that I am speaking with the correct person using two identifiers.  Location: Patient: home Provider: clinic   I discussed the limitations, risks, security and privacy concerns of performing an evaluation and management service by telephone and the availability of in person appointments. I also discussed with the patient that there may be a patient responsible charge related to this service. The patient expressed understanding and agreed to proceed.   History of Present Illness: Pt went to ER 12/31 for elevated bp-LE edema, headaches, ringing in the ear. Pt taking bp in the morning   Observations/Objective: Variable 170's-130's  Assessment and Plan: 1. Essential (primary) hypertension Add HCTZ 12.5/day and Potassium 12meq Continue Losartan daily  Follow Up Instructions:   I discussed the assessment and treatment plan with the patient. The patient was provided an opportunity to ask questions and all were answered. The patient agreed with the plan and demonstrated an understanding of the instructions.   The patient was advised to call back or seek an in-person evaluation if the symptoms worsen or if the condition fails to improve as anticipated.  I provided 12 minutes of non-face-to-face time during this encounter.   Nuria Phebus Hannah Beat, MD

## 2019-07-18 NOTE — Patient Instructions (Signed)
Add HCTZ 12.5mg  and Pravachol 20mg   Recheck bp if closed

## 2019-08-03 ENCOUNTER — Other Ambulatory Visit: Payer: Self-pay

## 2019-08-03 ENCOUNTER — Encounter: Payer: Self-pay | Admitting: Family Medicine

## 2019-08-03 ENCOUNTER — Ambulatory Visit: Payer: Medicare PPO | Admitting: Family Medicine

## 2019-08-03 VITALS — BP 123/76 | HR 79 | Temp 97.7°F | Wt 178.8 lb

## 2019-08-03 DIAGNOSIS — I10 Essential (primary) hypertension: Secondary | ICD-10-CM

## 2019-08-03 DIAGNOSIS — E039 Hypothyroidism, unspecified: Secondary | ICD-10-CM

## 2019-08-03 DIAGNOSIS — E559 Vitamin D deficiency, unspecified: Secondary | ICD-10-CM

## 2019-08-03 DIAGNOSIS — J45909 Unspecified asthma, uncomplicated: Secondary | ICD-10-CM

## 2019-08-03 DIAGNOSIS — F419 Anxiety disorder, unspecified: Secondary | ICD-10-CM | POA: Diagnosis not present

## 2019-08-03 DIAGNOSIS — R7309 Other abnormal glucose: Secondary | ICD-10-CM

## 2019-08-03 NOTE — Patient Instructions (Addendum)
Fasting labwork  Hypertension, Adult Hypertension is another name for high blood pressure. High blood pressure forces your heart to work harder to pump blood. This can cause problems over time. There are two numbers in a blood pressure reading. There is a top number (systolic) over a bottom number (diastolic). It is best to have a blood pressure that is below 120/80. Healthy choices can help lower your blood pressure, or you may need medicine to help lower it. What are the causes? The cause of this condition is not known. Some conditions may be related to high blood pressure. What increases the risk?  Smoking.  Having type 2 diabetes mellitus, high cholesterol, or both.  Not getting enough exercise or physical activity.  Being overweight.  Having too much fat, sugar, calories, or salt (sodium) in your diet.  Drinking too much alcohol.  Having long-term (chronic) kidney disease.  Having a family history of high blood pressure.  Age. Risk increases with age.  Race. You may be at higher risk if you are African American.  Gender. Men are at higher risk than women before age 5. After age 39, women are at higher risk than men.  Having obstructive sleep apnea.  Stress. What are the signs or symptoms?  High blood pressure may not cause symptoms. Very high blood pressure (hypertensive crisis) may cause: ? Headache. ? Feelings of worry or nervousness (anxiety). ? Shortness of breath. ? Nosebleed. ? A feeling of being sick to your stomach (nausea). ? Throwing up (vomiting). ? Changes in how you see. ? Very bad chest pain. ? Seizures. How is this treated?  This condition is treated by making healthy lifestyle changes, such as: ? Eating healthy foods. ? Exercising more. ? Drinking less alcohol.  Your health care provider may prescribe medicine if lifestyle changes are not enough to get your blood pressure under control, and if: ? Your top number is above 130. ? Your bottom  number is above 80.  Your personal target blood pressure may vary. Follow these instructions at home: Eating and drinking   If told, follow the DASH eating plan. To follow this plan: ? Fill one half of your plate at each meal with fruits and vegetables. ? Fill one fourth of your plate at each meal with whole grains. Whole grains include whole-wheat pasta, brown rice, and whole-grain bread. ? Eat or drink low-fat dairy products, such as skim milk or low-fat yogurt. ? Fill one fourth of your plate at each meal with low-fat (lean) proteins. Low-fat proteins include fish, chicken without skin, eggs, beans, and tofu. ? Avoid fatty meat, cured and processed meat, or chicken with skin. ? Avoid pre-made or processed food.  Eat less than 1,500 mg of salt each day.  Do not drink alcohol if: ? Your doctor tells you not to drink. ? You are pregnant, may be pregnant, or are planning to become pregnant.  If you drink alcohol: ? Limit how much you use to:  0-1 drink a day for women.  0-2 drinks a day for men. ? Be aware of how much alcohol is in your drink. In the [place] one drink equals one 12 oz bottle of beer (355 mL), one 5 oz glass of wine (148 mL), or one 1 oz glass of hard liquor (44 mL). Lifestyle   Work with your doctor to stay at a healthy weight or to lose weight. Ask your doctor what the best weight is for you.  Get at least 30 minutes  of exercise most days of the week. This may include walking, swimming, or biking.  Get at least 30 minutes of exercise that strengthens your muscles (resistance exercise) at least 3 days a week. This may include lifting weights or doing Pilates.  Do not use any products that contain nicotine or tobacco, such as cigarettes, e-cigarettes, and chewing tobacco. If you need help quitting, ask your doctor.  Check your blood pressure at home as told by your doctor.  Keep all follow-up visits as told by your doctor. This is important. Medicines  Take  over-the-counter and prescription medicines only as told by your doctor. Follow directions carefully.  Do not skip doses of blood pressure medicine. The medicine does not work as well if you skip doses. Skipping doses also puts you at risk for problems.  Ask your doctor about side effects or reactions to medicines that you should watch for. Contact a doctor if you:  Think you are having a reaction to the medicine you are taking.  Have headaches that keep coming back (recurring).  Feel dizzy.  Have swelling in your ankles.  Have trouble with your vision. Get help right away if you:  Get a very bad headache.  Start to feel mixed up (confused).  Feel weak or numb.  Feel faint.  Have very bad pain in your: ? Chest. ? Belly (abdomen).  Throw up more than once.  Have trouble breathing. Summary  Hypertension is another name for high blood pressure.  High blood pressure forces your heart to work harder to pump blood.  For most people, a normal blood pressure is less than 120/80.  Making healthy choices can help lower blood pressure. If your blood pressure does not get lower with healthy choices, you may need to take medicine. This information is not intended to replace advice given to you by your health care provider. Make sure you discuss any questions you have with your health care provider. Document Revised: 01/21/2018 Document Reviewed: 01/21/2018 Elsevier Patient Education  2020 [employer].

## 2019-08-03 NOTE — Progress Notes (Signed)
Established Patient Office Visit  Subjective:  Patient ID: Olivia Arnold, female    DOB: December 07, 1950  Age: 69 y.o. MRN: [id number]  CC:  Chief Complaint  Patient presents with  . Hypertension    2 week f/u    HPI Olivia Arnold presents for HTN-readings normal on current medications, last labwork lytes normal 12/20 Depression/anxiety-prozac -stable Asthma-trigger exercise, odor-causes SOB, pt does not use inhaler-walk with no concerns Hypothyroid-TSH normal 11/20 Stress test-normal Parathyroid removed-pt watched Calcium intake  Past Medical History:  Diagnosis Date  . ADHD (attention deficit hyperactivity disorder)   . Anxiety   . Anxiety disorder, unspecified   . Asthma   . Chest pain, unspecified   . Depression   . Essential (primary) hypertension   . Essential hypertension   . History of cardiac catheterization    Olivia Arnold July 2000 - normal coronaries  . Hypothyroidism   . Hypothyroidism, unspecified   . Major depressive disorder, single episode, unspecified   . Rosacea, unspecified   . Unspecified asthma, uncomplicated     Past Surgical History:  Procedure Laterality Date  . CATARACT EXTRACTION Bilateral 2018  . PARATHYROIDECTOMY    . THYROIDECTOMY    . TONSILLECTOMY      Family History  Problem Relation Age of Onset  . Heart attack Brother        Died age 27  . CAD Brother        Diagnosed age 11  . Aneurysm Mother        Brain  . Breast cancer Sister 7    Social History   Socioeconomic History  . Marital status: Married    Spouse name: Not on file  . Number of children: Not on file  . Years of education: Not on file  . Highest education level: Not on file  Occupational History  . Not on file  Tobacco Use  . Smoking status: Former Smoker    Packs/day: 1.00    Types: Cigarettes    Start date: 09/20/1968  . Smokeless tobacco: Never Used  Substance and Sexual Activity  . Alcohol use: Not Currently    Alcohol/week: 0.0 standard drinks  .  Drug use: No  . Sexual activity: Not on file  Other Topics Concern  . Not on file  Social History Narrative  . Not on file   Social Determinants of Health   Financial Resource Strain:   . Difficulty of Paying Living Expenses: Not on file  Food Insecurity:   . Worried About Charity fundraiser in the Last Year: Not on file  . Ran Out of Food in the Last Year: Not on file  Transportation Needs:   . Lack of Transportation (Medical): Not on file  . Lack of Transportation (Non-Medical): Not on file  Physical Activity:   . Days of Exercise per Week: Not on file  . Minutes of Exercise per Session: Not on file  Stress:   . Feeling of Stress : Not on file  Social Connections:   . Frequency of Communication with Friends and Family: Not on file  . Frequency of Social Gatherings with Friends and Family: Not on file  . Attends Religious Services: Not on file  . Active Member of Clubs or Organizations: Not on file  . Attends Archivist Meetings: Not on file  . Marital Status: Not on file  Intimate Partner Violence:   . Fear of Current or Ex-Partner: Not on file  . Emotionally Abused: Not  on file  . Physically Abused: Not on file  . Sexually Abused: Not on file    Outpatient Medications Prior to Visit  Medication Sig Dispense Refill  . cholecalciferol (VITAMIN D3) 25 MCG (1000 UT) tablet Take 1,000 Units by mouth daily.    Marland Kitchen FLUoxetine (PROZAC) 40 MG capsule Take 1 capsule (40 mg total) by mouth daily. 90 capsule 1  . folic acid (FOLVITE) 1 MG tablet Take 400 mg by mouth daily.     . hydrochlorothiazide (MICROZIDE) 12.5 MG capsule Take 1 capsule (12.5 mg total) by mouth daily. 30 capsule 0  . levothyroxine (SYNTHROID) 100 MCG tablet Take 1 tablet (100 mcg total) by mouth daily before breakfast. 90 tablet 3  . losartan (COZAAR) 100 MG tablet Take 1 tablet (100 mg total) by mouth daily. 90 tablet 1  . montelukast (SINGULAIR) 10 MG tablet Take 1 tablet (10 mg total) by mouth at  bedtime. 90 tablet 3  . potassium chloride SA (KLOR-CON) 20 MEQ tablet Take 1 tablet (20 mEq total) by mouth daily. 30 tablet 3  . PREMARIN vaginal cream Place 0.5 Applicatorfuls vaginally 2 (two) times a week. 42.5 g 11  . vitamin B-12 (CYANOCOBALAMIN) 500 MCG tablet Take 500 mcg by mouth daily.    Marland Kitchen FLUoxetine (PROZAC) 20 MG tablet      No facility-administered medications prior to visit.    No Known Allergies  ROS Review of Systems  Constitutional: Negative.   HENT: Negative.   Eyes: Negative.   Respiratory:       Asthma-does not use MDI  Cardiovascular:       Stress test and cath normal  Gastrointestinal: Negative.   Endocrine:       Elevated glucose Parathyroid removed 1 lobe Thyroid surgery for CA  Genitourinary: Negative.   Musculoskeletal: Positive for arthralgias and myalgias.  Skin:       Rosea   Allergic/Immunologic: Negative.   Neurological: Negative.   Hematological: Negative.   Psychiatric/Behavioral: The patient is nervous/anxious.       Objective:    Physical Exam  Constitutional: She is oriented to person, place, and time. She appears well-developed and well-nourished.  HENT:  Head: Normocephalic and atraumatic.  Eyes: Conjunctivae are normal.  Cardiovascular: Normal rate and regular rhythm.  Pulmonary/Chest: Effort normal and breath sounds normal.  Musculoskeletal:     Comments: Calf weakness since falling  Neurological: She is oriented to person, place, and time.  Psychiatric: She has a normal mood and affect.    BP 123/76 (BP Location: Left Arm, Patient Position: Sitting)   Pulse 79   Temp 97.7 F (36.5 C) (Temporal)   Wt 178 lb 12.8 oz (81.1 kg)   SpO2 98%   BMI 30.69 kg/m  Wt Readings from Last 3 Encounters:  08/03/19 178 lb 12.8 oz (81.1 kg)  07/14/19 177 lb (80.3 kg)  06/30/19 177 lb 9.6 oz (80.6 kg)     Health Maintenance Due  Topic Date Due  . COLONOSCOPY  06/17/2001  . PNA vac Low Risk Adult (1 of 2 - PCV13) 06/17/2016     Lab Results  Component Value Date   TSH 0.51 04/09/2019   Lab Results  Component Value Date   WBC 11.8 (H) 05/27/2019   HGB 14.1 05/27/2019   HCT 43.4 05/27/2019   MCV 95.0 05/27/2019   PLT 267 05/27/2019   Lab Results  Component Value Date   NA 139 05/27/2019   K 3.4 (L) 05/27/2019   CO2 25  05/27/2019   GLUCOSE 106 (H) 05/27/2019   BUN 15 05/27/2019   CREATININE 0.58 05/27/2019   BILITOT 1.0 05/27/2019   ALKPHOS 63 05/27/2019   AST 23 05/27/2019   ALT 27 05/27/2019   PROT 7.3 05/27/2019   ALBUMIN 4.1 05/27/2019   CALCIUM 9.0 05/27/2019   ANIONGAP 12 05/27/2019   Lab Results  Component Value Date   CHOL 216 (A) 04/09/2019   Lab Results  Component Value Date   HDL 50 04/09/2019   Lab Results  Component Value Date   LDLCALC 133 04/09/2019   Lab Results  Component Value Date   TRIG 192 (A) 04/09/2019   No results found for: [facility] Lab Results  Component Value Date   HGBA1C 6.1 06/06/2017      Assessment & Plan:  1. Hypothyroidism, adult TSH-stable  2. Essential (primary) hypertension Cozaar, HCTZ, K-h/o hypokalemia-labwork pending  3. Anxiety disorder, unspecified type Prozac-stable-PHQ9-6  4. Uncomplicated asthma, unspecified asthma severity, unspecified whether persistent Pt is not using MDI-using singulair  5. Vitamin D deficiency - CBC w/Diff/Platelet - VITAMIN D 25 Hydroxy (Vit-D Deficiency, Fractures)  6. Elevated glucose - CBC w/Diff/Platelet - Hemoglobin A1c   cologuard-did not send for assessment Follow-up: 6 months-fasting labwork,  Espiridion Supinski Hannah Beat, MD

## 2019-08-11 ENCOUNTER — Other Ambulatory Visit: Payer: Self-pay | Admitting: Emergency Medicine

## 2019-08-11 DIAGNOSIS — I10 Essential (primary) hypertension: Secondary | ICD-10-CM

## 2019-08-11 MED ORDER — HYDROCHLOROTHIAZIDE 12.5 MG PO CAPS: 12.5000 mg | ORAL_CAPSULE | Freq: Every day | ORAL | 1 refills | Status: DC

## 2019-09-08 DIAGNOSIS — Z1212 Encounter for screening for malignant neoplasm of rectum: Secondary | ICD-10-CM | POA: Diagnosis not present

## 2019-09-08 DIAGNOSIS — Z1211 Encounter for screening for malignant neoplasm of colon: Secondary | ICD-10-CM | POA: Diagnosis not present

## 2019-09-30 ENCOUNTER — Telehealth: Payer: Self-pay | Admitting: Family Medicine

## 2019-09-30 NOTE — Telephone Encounter (Signed)
Patient is calling to get her cologuard results. She states she received a letter in the mail stating her results were sent to her pcp.

## 2019-09-30 NOTE — Telephone Encounter (Signed)
Please advise 

## 2019-09-30 NOTE — Telephone Encounter (Signed)
I called [facility], Dr. Luan Pulling had ordered the test , I spoke with pt to advise she needs to sign a release in order for them to release results, she will stop in on Friday

## 2019-09-30 NOTE — Telephone Encounter (Signed)
I do not see the cologuard. We need to call for the results since we ordered but did not receive results

## 2019-10-04 ENCOUNTER — Other Ambulatory Visit: Payer: Self-pay | Admitting: Family Medicine

## 2019-10-04 DIAGNOSIS — I10 Essential (primary) hypertension: Secondary | ICD-10-CM

## 2019-10-15 DIAGNOSIS — H35373 Puckering of macula, bilateral: Secondary | ICD-10-CM | POA: Diagnosis not present

## 2019-10-26 ENCOUNTER — Other Ambulatory Visit: Payer: Self-pay | Admitting: Family Medicine

## 2019-11-09 DIAGNOSIS — J302 Other seasonal allergic rhinitis: Secondary | ICD-10-CM | POA: Diagnosis not present

## 2019-11-09 DIAGNOSIS — J4520 Mild intermittent asthma, uncomplicated: Secondary | ICD-10-CM | POA: Diagnosis not present

## 2019-11-09 DIAGNOSIS — E559 Vitamin D deficiency, unspecified: Secondary | ICD-10-CM | POA: Diagnosis not present

## 2019-11-09 DIAGNOSIS — E782 Mixed hyperlipidemia: Secondary | ICD-10-CM | POA: Diagnosis not present

## 2019-11-09 DIAGNOSIS — N905 Atrophy of vulva: Secondary | ICD-10-CM | POA: Diagnosis not present

## 2019-11-09 DIAGNOSIS — F411 Generalized anxiety disorder: Secondary | ICD-10-CM | POA: Diagnosis not present

## 2019-11-09 DIAGNOSIS — E039 Hypothyroidism, unspecified: Secondary | ICD-10-CM | POA: Diagnosis not present

## 2019-11-09 DIAGNOSIS — Z0189 Encounter for other specified special examinations: Secondary | ICD-10-CM | POA: Diagnosis not present

## 2019-11-09 DIAGNOSIS — E079 Disorder of thyroid, unspecified: Secondary | ICD-10-CM | POA: Diagnosis not present

## 2019-11-18 DIAGNOSIS — R7301 Impaired fasting glucose: Secondary | ICD-10-CM | POA: Diagnosis not present

## 2019-11-18 DIAGNOSIS — Z0189 Encounter for other specified special examinations: Secondary | ICD-10-CM | POA: Diagnosis not present

## 2019-11-18 DIAGNOSIS — E559 Vitamin D deficiency, unspecified: Secondary | ICD-10-CM | POA: Diagnosis not present

## 2019-11-18 DIAGNOSIS — E785 Hyperlipidemia, unspecified: Secondary | ICD-10-CM | POA: Diagnosis not present

## 2019-11-18 DIAGNOSIS — E079 Disorder of thyroid, unspecified: Secondary | ICD-10-CM | POA: Diagnosis not present

## 2019-11-18 DIAGNOSIS — D518 Other vitamin B12 deficiency anemias: Secondary | ICD-10-CM | POA: Diagnosis not present

## 2019-11-23 DIAGNOSIS — F331 Major depressive disorder, recurrent, moderate: Secondary | ICD-10-CM | POA: Diagnosis not present

## 2019-11-23 DIAGNOSIS — E782 Mixed hyperlipidemia: Secondary | ICD-10-CM | POA: Diagnosis not present

## 2019-11-23 DIAGNOSIS — E079 Disorder of thyroid, unspecified: Secondary | ICD-10-CM | POA: Diagnosis not present

## 2019-11-23 DIAGNOSIS — E559 Vitamin D deficiency, unspecified: Secondary | ICD-10-CM | POA: Diagnosis not present

## 2019-11-23 DIAGNOSIS — E039 Hypothyroidism, unspecified: Secondary | ICD-10-CM | POA: Diagnosis not present

## 2019-11-23 DIAGNOSIS — J302 Other seasonal allergic rhinitis: Secondary | ICD-10-CM | POA: Diagnosis not present

## 2019-11-23 DIAGNOSIS — F411 Generalized anxiety disorder: Secondary | ICD-10-CM | POA: Diagnosis not present

## 2019-11-23 DIAGNOSIS — J4520 Mild intermittent asthma, uncomplicated: Secondary | ICD-10-CM | POA: Diagnosis not present

## 2019-11-23 DIAGNOSIS — N905 Atrophy of vulva: Secondary | ICD-10-CM | POA: Diagnosis not present

## 2019-12-03 ENCOUNTER — Other Ambulatory Visit: Payer: Self-pay | Admitting: Family Medicine

## 2019-12-03 DIAGNOSIS — I10 Essential (primary) hypertension: Secondary | ICD-10-CM

## 2019-12-14 ENCOUNTER — Other Ambulatory Visit: Payer: Self-pay | Admitting: Family Medicine

## 2019-12-14 DIAGNOSIS — I10 Essential (primary) hypertension: Secondary | ICD-10-CM

## 2019-12-15 ENCOUNTER — Other Ambulatory Visit: Payer: Self-pay | Admitting: Obstetrics and Gynecology

## 2019-12-15 DIAGNOSIS — Z1231 Encounter for screening mammogram for malignant neoplasm of breast: Secondary | ICD-10-CM

## 2019-12-27 ENCOUNTER — Ambulatory Visit
Admission: RE | Admit: 2019-12-27 | Discharge: 2019-12-27 | Disposition: A | Discharge status: Home / Self Care | Payer: Medicare PPO | Source: Ambulatory Visit | Attending: Obstetrics and Gynecology | Admitting: Obstetrics and Gynecology

## 2019-12-27 ENCOUNTER — Other Ambulatory Visit: Payer: Self-pay

## 2019-12-27 DIAGNOSIS — Z1231 Encounter for screening mammogram for malignant neoplasm of breast: Secondary | ICD-10-CM | POA: Diagnosis not present

## 2020-01-13 ENCOUNTER — Encounter (HOSPITAL_COMMUNITY): Payer: Self-pay | Admitting: Emergency Medicine

## 2020-01-13 ENCOUNTER — Emergency Department (HOSPITAL_COMMUNITY): Payer: Medicare PPO

## 2020-01-13 ENCOUNTER — Emergency Department (HOSPITAL_COMMUNITY)
Admission: EM | Admit: 2020-01-13 | Discharge: 2020-01-13 | Disposition: A | Discharge status: Home / Self Care | Payer: Medicare PPO | Attending: Emergency Medicine | Admitting: Emergency Medicine

## 2020-01-13 ENCOUNTER — Other Ambulatory Visit: Payer: Self-pay

## 2020-01-13 DIAGNOSIS — B349 Viral infection, unspecified: Secondary | ICD-10-CM | POA: Diagnosis not present

## 2020-01-13 DIAGNOSIS — I10 Essential (primary) hypertension: Secondary | ICD-10-CM | POA: Insufficient documentation

## 2020-01-13 DIAGNOSIS — E039 Hypothyroidism, unspecified: Secondary | ICD-10-CM | POA: Diagnosis not present

## 2020-01-13 DIAGNOSIS — J45909 Unspecified asthma, uncomplicated: Secondary | ICD-10-CM | POA: Diagnosis not present

## 2020-01-13 DIAGNOSIS — R0602 Shortness of breath: Secondary | ICD-10-CM | POA: Diagnosis not present

## 2020-01-13 DIAGNOSIS — Z87891 Personal history of nicotine dependence: Secondary | ICD-10-CM | POA: Diagnosis not present

## 2020-01-13 DIAGNOSIS — Z20822 Contact with and (suspected) exposure to covid-19: Secondary | ICD-10-CM | POA: Insufficient documentation

## 2020-01-13 LAB — CBC WITH DIFFERENTIAL/PLATELET
Abs Immature Granulocytes: 0.02 10*3/uL (ref 0.00–0.07)
Basophils Absolute: 0.1 10*3/uL (ref 0.0–0.1)
Basophils Relative: 1 %
Eosinophils Absolute: 0.7 10*3/uL — ABNORMAL HIGH (ref 0.0–0.5)
Eosinophils Relative: 8 %
HCT: 43.7 % (ref 36.0–46.0)
Hemoglobin: 14 g/dL (ref 12.0–15.0)
Immature Granulocytes: 0 %
Lymphocytes Relative: 28 %
Lymphs Abs: 2.2 10*3/uL (ref 0.7–4.0)
MCH: 30.8 pg (ref 26.0–34.0)
MCHC: 32 g/dL (ref 30.0–36.0)
MCV: 96.3 fL (ref 80.0–100.0)
Monocytes Absolute: 0.5 10*3/uL (ref 0.1–1.0)
Monocytes Relative: 6 %
Neutro Abs: 4.4 10*3/uL (ref 1.7–7.7)
Neutrophils Relative %: 57 %
Platelets: 298 10*3/uL (ref 150–400)
RBC: 4.54 MIL/uL (ref 3.87–5.11)
RDW: 12 % (ref 11.5–15.5)
WBC: 7.9 10*3/uL (ref 4.0–10.5)
nRBC: 0 % (ref 0.0–0.2)

## 2020-01-13 LAB — TROPONIN I (HIGH SENSITIVITY)
Troponin I (High Sensitivity): 3 ng/L (ref <18)
Troponin I (High Sensitivity): 4 ng/L (ref <18)

## 2020-01-13 LAB — COMPREHENSIVE METABOLIC PANEL
ALT: 23 U/L (ref 0–44)
AST: 24 U/L (ref 15–41)
Albumin: 4.3 g/dL (ref 3.5–5.0)
Alkaline Phosphatase: 57 U/L (ref 38–126)
Anion gap: 12 (ref 5–15)
BUN: 16 mg/dL (ref 8–23)
CO2: 26 mmol/L (ref 22–32)
Calcium: 9.7 mg/dL (ref 8.9–10.3)
Chloride: 104 mmol/L (ref 98–111)
Creatinine, Ser: 0.6 mg/dL (ref 0.44–1.00)
GFR calc Af Amer: >60 mL/min (ref >60)
GFR calc non Af Amer: >60 mL/min (ref >60)
Glucose, Bld: 109 mg/dL — ABNORMAL HIGH (ref 70–99)
Potassium: 4 mmol/L (ref 3.5–5.1)
Sodium: 142 mmol/L (ref 135–145)
Total Bilirubin: 0.9 mg/dL (ref 0.3–1.2)
Total Protein: 7.4 g/dL (ref 6.5–8.1)

## 2020-01-13 LAB — SARS CORONAVIRUS 2 BY RT PCR (HOSPITAL ORDER, PERFORMED IN ~~LOC~~ HOSPITAL LAB): SARS Coronavirus 2: NEGATIVE

## 2020-01-13 MED ORDER — ALBUTEROL SULFATE HFA 108 (90 BASE) MCG/ACT IN AERS
2.0000 | INHALATION_SPRAY | Freq: Once | RESPIRATORY_TRACT | Status: AC
  Administered 2020-01-13: 2 via RESPIRATORY_TRACT
  Filled 2020-01-13: qty 6.7

## 2020-01-13 NOTE — Discharge Instructions (Addendum)
You were evaluated in the Emergency Department and after careful evaluation, we did not find any emergent condition requiring admission or further testing in the hospital.  Your exam/testing today was overall reassuring.  Your symptoms seem to be due to a viral illness.  Your testing did not show any heart damage, no pneumonia.  Your coronavirus test was negative.  Please return to the Emergency Department if you experience any worsening of your condition.  Thank you for allowing [place] to be a part of your care.

## 2020-01-13 NOTE — ED Provider Notes (Signed)
[facility] Emergency Department Provider Note MRN:  [id number]  Arrival date & time: 01/13/20     Chief Complaint   Shortness of Breath   History of Present Illness   Olivia Arnold is a 69 y.o. year-old female with a history of hypertension, asthma presenting to the ED with chief complaint of shortness of breath.  3 days of nasal congestion, shortness of breath, dull frontal headache, mild body aches.  Denies fever, mild cough.  Tightness in the chest intermittently as well.  Mild in severity, no exacerbating or alleviating factors.  Vaccinated for Covid.  Review of Systems  A complete 10 system review of systems was obtained and all systems are negative except as noted in the HPI and PMH.   Patient's Health History    Past Medical History:  Diagnosis Date  . ADHD (attention deficit hyperactivity disorder)   . Anxiety   . Anxiety disorder, unspecified   . Asthma   . Chest pain, unspecified   . Depression   . Essential (primary) hypertension   . Essential hypertension   . History of cardiac catheterization    [facility] July 2000 - normal coronaries  . Hypothyroidism   . Hypothyroidism, unspecified   . Major depressive disorder, single episode, unspecified   . Rosacea, unspecified   . Unspecified asthma, uncomplicated     Past Surgical History:  Procedure Laterality Date  . CATARACT EXTRACTION Bilateral 2018  . PARATHYROIDECTOMY    . THYROIDECTOMY    . TONSILLECTOMY      Family History  Problem Relation Age of Onset  . Heart attack Brother        Died age 10  . CAD Brother        Diagnosed age 74  . Aneurysm Mother        Brain  . Breast cancer Sister 28    Social History   Socioeconomic History  . Marital status: Married    Spouse name: Not on file  . Number of children: Not on file  . Years of education: Not on file  . Highest education level: Not on file  Occupational History  . Not on file  Tobacco Use  . Smoking status: Former  Smoker    Packs/day: 1.00    Types: Cigarettes    Start date: 09/20/1968  . Smokeless tobacco: Never Used  Substance and Sexual Activity  . Alcohol use: Not Currently    Alcohol/week: 0.0 standard drinks  . Drug use: No  . Sexual activity: Not on file  Other Topics Concern  . Not on file  Social History Narrative  . Not on file   Social Determinants of Health   Financial Resource Strain:   . Difficulty of Paying Living Expenses: Not on file  Food Insecurity:   . Worried About Charity fundraiser in the Last Year: Not on file  . Ran Out of Food in the Last Year: Not on file  Transportation Needs:   . Lack of Transportation (Medical): Not on file  . Lack of Transportation (Non-Medical): Not on file  Physical Activity:   . Days of Exercise per Week: Not on file  . Minutes of Exercise per Session: Not on file  Stress:   . Feeling of Stress : Not on file  Social Connections:   . Frequency of Communication with Friends and Family: Not on file  . Frequency of Social Gatherings with Friends and Family: Not on file  . Attends Religious Services:  Not on file  . Active Member of Clubs or Organizations: Not on file  . Attends Archivist Meetings: Not on file  . Marital Status: Not on file  Intimate Partner Violence:   . Fear of Current or Ex-Partner: Not on file  . Emotionally Abused: Not on file  . Physically Abused: Not on file  . Sexually Abused: Not on file     Physical Exam   Vitals:   01/13/20 1112 01/13/20 1312  BP: (!) 185/93   Pulse: 89   Resp: 16   Temp: 98.4 F (36.9 C)   SpO2: 98% 98%    CONSTITUTIONAL: Well-appearing, NAD NEURO:  Alert and oriented x 3, no focal deficits EYES:  eyes equal and reactive ENT/NECK:  no LAD, no JVD CARDIO: Regular rate, well-perfused, normal S1 and S2 PULM:  CTAB no wheezing or rhonchi GI/GU:  normal bowel sounds, non-distended, non-tender MSK/SPINE:  No gross deformities, no edema SKIN:  no rash, atraumatic PSYCH:   Appropriate speech and behavior  *Additional and/or pertinent findings included in MDM below  Diagnostic and Interventional Summary    EKG Interpretation  Date/Time:  Thursday January 13 2020 11:16:19 EDT Ventricular Rate:  81 PR Interval:  152 QRS Duration: 78 QT Interval:  380 QTC Calculation: 441 R Axis:   60 Text Interpretation: Normal sinus rhythm Normal ECG Confirmed by Gerlene Fee [phone]) on 01/13/2020 1:02:43 PM      Labs Reviewed  CBC WITH DIFFERENTIAL/PLATELET - Abnormal; Notable for the following components:      Result Value   Eosinophils Absolute 0.7 (*)    All other components within normal limits  COMPREHENSIVE METABOLIC PANEL - Abnormal; Notable for the following components:   Glucose, Bld 109 (*)    All other components within normal limits  SARS CORONAVIRUS 2 BY RT PCR (HOSPITAL ORDER, [place] [facility])  TROPONIN I (HIGH SENSITIVITY)  TROPONIN I (HIGH SENSITIVITY)    DG Chest Port 1 View  Final Result      Medications  albuterol (VENTOLIN HFA) 108 (90 Base) MCG/ACT inhaler 2 puff (2 puffs Inhalation Given 01/13/20 1312)     Procedures  /  Critical Care Procedures  ED Course and Medical Decision Making  I have reviewed the triage vital signs, the nursing notes, and pertinent available records from the EMR.  Listed above are laboratory and imaging tests that I personally ordered, reviewed, and interpreted and then considered in my medical decision making (see below for details).  Work-up is reassuring, no evidence of pneumonia, Covid negative, atypical chest pain with negative troponin, reassuring EKG.  Nothing to suggest ACS.  No evidence of DVT on exam, no increased work of breathing, doubt PE.  Suspect viral illness, appropriate for discharge.       Barth Kirks. [place]onia Small, [place] [email or web address]  Final Clinical Impressions(s) / ED Diagnoses     ICD-10-CM   1. Viral  illness  B34.9   2. SOB (shortness of breath)  R06.02 DG Chest [facility] 1 View    DG Chest Port 1 View    ED Discharge Orders    None       Discharge Instructions Discussed with and Provided to Patient:     Discharge Instructions     You were evaluated in the Emergency Department and after careful evaluation, we did not find any emergent condition requiring admission or further testing in the hospital.  Your exam/testing today was  overall reassuring.  Your symptoms seem to be due to a viral illness.  Your testing did not show any heart damage, no pneumonia.  Your coronavirus test was negative.  Please return to the Emergency Department if you experience any worsening of your condition.  Thank you for allowing [place] to be a part of your care.        Maudie Flakes, MD 01/13/20 1447

## 2020-01-13 NOTE — ED Triage Notes (Signed)
Pt c/o nasal congestion with shortness of breath for the past 3 days. Pt states she normally has seasonal allergies with no known covid exposure.

## 2020-02-01 ENCOUNTER — Other Ambulatory Visit: Payer: Self-pay | Admitting: Family Medicine

## 2020-03-09 ENCOUNTER — Ambulatory Visit: Payer: Medicare PPO | Admitting: Physician Assistant

## 2020-03-14 ENCOUNTER — Other Ambulatory Visit: Payer: Self-pay | Admitting: Family Medicine

## 2020-03-22 ENCOUNTER — Other Ambulatory Visit: Payer: Self-pay | Admitting: Family Medicine

## 2020-04-17 ENCOUNTER — Other Ambulatory Visit: Payer: Self-pay | Admitting: Family Medicine

## 2020-04-17 DIAGNOSIS — D171 Benign lipomatous neoplasm of skin and subcutaneous tissue of trunk: Secondary | ICD-10-CM

## 2020-05-02 ENCOUNTER — Other Ambulatory Visit: Payer: Self-pay

## 2020-05-02 ENCOUNTER — Ambulatory Visit: Payer: Medicare PPO | Admitting: General Surgery

## 2020-05-02 ENCOUNTER — Encounter: Payer: Self-pay | Admitting: General Surgery

## 2020-05-02 VITALS — BP 163/79 | HR 74 | Temp 98.2°F | Resp 16 | Ht 64.0 in | Wt 182.0 lb

## 2020-05-02 DIAGNOSIS — D1721 Benign lipomatous neoplasm of skin and subcutaneous tissue of right arm: Secondary | ICD-10-CM | POA: Diagnosis not present

## 2020-05-02 NOTE — Patient Instructions (Signed)
Will get [place] to further assess the depth and extent of the lipoma.  Will call with results.   Lipoma Removal  Lipoma removal is a surgical procedure to remove a lipoma, which is a noncancerous (benign) tumor that is made up of fat cells. Most lipomas are small and painless and do not require treatment. They can form in many areas of the body but are most common under the skin of the back, arms, shoulders, buttocks, and thighs. You may need lipoma removal if you have a lipoma that is large, growing, or causing discomfort. Lipoma removal may also be done for cosmetic reasons. Tell a health care provider about:  Any allergies you have.  All medicines you are taking, including vitamins, herbs, eye drops, creams, and over-the-counter medicines.  Any problems you or family members have had with anesthetic medicines.  Any blood disorders you have.  Any surgeries you have had.  Any medical conditions you have.  Whether you are pregnant or may be pregnant. What are the risks? Generally, this is a safe procedure. However, problems may occur, including:  Infection.  Bleeding.  Scarring.  Allergic reactions to medicines.  Damage to nearby structures or organs, such as damage to nerves or blood vessels near the lipoma. Medicines Ask your health care provider about:  Changing or stopping your regular medicines. This is especially important if you are taking diabetes medicines or blood thinners.  Taking medicines such as aspirin and ibuprofen. These medicines can thin your blood. Do not take these medicines unless your health care provider tells you to take them.  Taking over-the-counter medicines, vitamins, herbs, and supplements. General instructions  You will have a physical exam. Your health care provider will check the size of the lipoma and whether it can be moved easily.  You may have a biopsy and imaging tests, such as X-rays, a CT scan, and an MRI.  Do not use any products  that contain nicotine or tobacco for at least 4 weeks before the procedure. These products include cigarettes, e-cigarettes, and chewing tobacco. If you need help quitting, ask your health care provider.  Ask your health care provider: ? How your surgery site will be marked. ? What steps will be taken to help prevent infection. These may include:  Washing skin with a germ-killing soap.  Taking antibiotic medicine.  Plan to have someone take you home from the hospital or clinic.  If you will be going home right after the procedure, plan to have someone with you for 24 hours. What happens during the procedure?   An IV will be inserted into one of your veins.  You will be given one or more of the following: ? A medicine to help you relax (sedative). ? A medicine to numb the area (local anesthetic). ? A medicine to make you fall asleep (general anesthetic). ? A medicine that is injected into an area of your body to numb everything below the injection site (regional anesthetic).  An incision will be made over the lipoma or very near the lipoma. The incision may be made in a natural skin line or crease.  Tissues, nerves, and blood vessels near the lipoma will be moved out of the way.  The lipoma and the capsule that surrounds it will be separated from the surrounding tissues.  The lipoma will be removed.  The incision may be closed with stitches (sutures).  A bandage (dressing) will be placed over the incision. The procedure may vary among health care  providers and hospitals. What happens after the procedure?  Your blood pressure, heart rate, breathing rate, and blood oxygen level will be monitored until you leave the hospital or clinic.  If you were prescribed an antibiotic medicine, use it as told by your health care provider. Do not stop using the antibiotic even if you start to feel better.  If you were given a sedative during the procedure, it can affect you for several  hours. Do not drive or operate machinery until your health care provider says that it is safe. Summary  Before the procedure, follow instructions from your health care provider about eating and drinking, and changing or stopping your regular medicines. This is especially important if you are taking diabetes medicines or blood thinners.  After the lipoma is removed, the incision may be closed with stitches (sutures) and covered with a bandage (dressing).  If you were given a sedative during the procedure, it can affect you for several hours. Do not drive or operate machinery until your health care provider says that it is safe. This information is not intended to replace advice given to you by your health care provider. Make sure you discuss any questions you have with your health care provider. Document Revised: 12/28/2018 Document Reviewed: 12/28/2018 Elsevier Patient Education  [place].

## 2020-05-04 NOTE — Progress Notes (Signed)
[facility] History and Physical   Reason for Referral: Right arm/ shoulder lipoma  Referring Physician:  Celene Squibb, MD  Chief Complaint    New Patient (Initial Visit)      Olivia Arnold is a 69 y.o. female.  HPI:  Olivia Arnold is a very sweet 69 yo who has noticed a mass / lipoma on her right posterior shoulder area for years. She has been seen by dermatology who told her to leave it alone and then saw another one when it was growing and they told her they could not remove it.  She says that she really does not have major discomfort in the area but that it does show in her clothes and it has been growing which makes her concerned.   She denies any prior history of any drainage or infection in the area. She says that she really first noticed it after injuring shoulder one time. She is otherwise well and wants to avoid surgery if possible.   Past Medical History:  Diagnosis Date  . ADHD (attention deficit hyperactivity disorder)   . Anxiety   . Anxiety disorder, unspecified   . Asthma   . Chest pain, unspecified   . Depression   . Essential (primary) hypertension   . Essential hypertension   . History of cardiac catheterization    [facility] July 2000 - normal coronaries  . Hypothyroidism   . Hypothyroidism, unspecified   . Major depressive disorder, single episode, unspecified   . Rosacea, unspecified   . Unspecified asthma, uncomplicated     Past Surgical History:  Procedure Laterality Date  . CATARACT EXTRACTION Bilateral 2018  . PARATHYROIDECTOMY    . THYROIDECTOMY    . TONSILLECTOMY      Family History  Problem Relation Age of Onset  . Heart attack Brother        Died age 52  . CAD Brother        Diagnosed age 22  . Aneurysm Mother        Brain  . Breast cancer Sister 47    Social History   Tobacco Use  . Smoking status: Former Smoker    Packs/day: 1.00    Types: Cigarettes    Start date: 09/20/1968  . Smokeless tobacco: Never Used   Substance Use Topics  . Alcohol use: Not Currently    Alcohol/week: 0.0 standard drinks  . Drug use: No    Medications: I have reviewed the patient's current medications. Allergies as of 05/02/2020   No Known Allergies     Medication List       Accurate as of May 02, 2020 11:59 PM. If you have any questions, ask your nurse or doctor.        cholecalciferol 25 MCG (1000 UNIT) tablet Commonly known as: VITAMIN D3 Take 1,000 Units by mouth daily.   FLUoxetine 40 MG capsule Commonly known as: PROZAC Take 1 capsule (40 mg total) by mouth daily.   folic acid 1 MG tablet Commonly known as: FOLVITE Take 400 mg by mouth daily.   hydrochlorothiazide 12.5 MG capsule Commonly known as: MICROZIDE TAKE 1 CAPSULE(12.5 MG) BY MOUTH DAILY   levothyroxine 100 MCG tablet Commonly known as: SYNTHROID Take 1 tablet (100 mcg total) by mouth daily before breakfast.   losartan 100 MG tablet Commonly known as: COZAAR Take 1 tablet (100 mg total) by mouth daily.   montelukast 10 MG tablet Commonly known as: SINGULAIR Take 1 tablet (10 mg total)  by mouth at bedtime.   potassium chloride SA 20 MEQ tablet Commonly known as: KLOR-CON TAKE 1 TABLET(20 MEQ) BY MOUTH DAILY   Premarin vaginal cream Generic drug: conjugated estrogens Place 0.5 Applicatorfuls vaginally 2 (two) times a week.   vitamin B-12 500 MCG tablet Commonly known as: CYANOCOBALAMIN Take 500 mcg by mouth daily.        ROS:  A comprehensive review of systems was negative except for: Musculoskeletal: positive for neck pain and joint pain, right shoulder lipoma  Blood pressure (!) 163/79, pulse 74, temperature 98.2 F (36.8 C), temperature source Oral, resp. rate 16, height 5\' 4"  (1.626 m), weight 182 lb (82.6 kg), SpO2 97 %. Physical Exam Vitals reviewed.  Constitutional:      Appearance: She is normal weight.  HENT:     Head: Normocephalic.     Nose: Nose normal.  Eyes:     Extraocular Movements:  Extraocular movements intact.  Cardiovascular:     Rate and Rhythm: Normal rate and regular rhythm.  Pulmonary:     Breath sounds: Normal breath sounds.  Abdominal:     General: There is no distension.     Palpations: Abdomen is soft.     Tenderness: There is no abdominal tenderness.  Musculoskeletal:        General: No swelling.     Cervical back: Normal range of motion.     Comments: Right posterior shoulder area, just medial to the joint, 8cm superficial mass that does not move with rotating and movement of arm   Skin:    General: Skin is warm.  Neurological:     General: No focal deficit present.     Mental Status: She is alert.  Psychiatric:        Mood and Affect: Mood normal.        Behavior: Behavior normal.        Thought Content: Thought content normal.     Results: None   Assessment & Plan:  Olivia Arnold is a 69 y.o. female with what we think is a lipoma in the right posterior shoulder area. It is fairly close to the joint and feels pretty superficial but given the location we will get an [place] to further assess before discussing surgical options versus continued observation.   - [place] ordered of the right shoulder soft tissue area  - Will call with results and make further plans.  - Is too large to remove in the clinic, so would need sedation at least if we opt to remove. Discuss risk of bleeding, infection, recurrence, injury/ weakness of shoulder muscles.   All questions were answered to the satisfaction of the patient.   Olivia Arnold 05/04/2020, 1:28 PM

## 2020-05-09 ENCOUNTER — Ambulatory Visit (HOSPITAL_COMMUNITY): Payer: Medicare PPO

## 2020-05-23 ENCOUNTER — Other Ambulatory Visit: Payer: Self-pay | Admitting: General Surgery

## 2020-05-23 ENCOUNTER — Other Ambulatory Visit: Payer: Self-pay

## 2020-05-23 ENCOUNTER — Ambulatory Visit (HOSPITAL_COMMUNITY)
Admission: RE | Admit: 2020-05-23 | Discharge: 2020-05-23 | Disposition: A | Discharge status: Home / Self Care | Payer: Medicare PPO | Source: Ambulatory Visit | Attending: General Surgery | Admitting: General Surgery

## 2020-05-23 DIAGNOSIS — D1721 Benign lipomatous neoplasm of skin and subcutaneous tissue of right arm: Secondary | ICD-10-CM

## 2020-05-24 ENCOUNTER — Telehealth: Payer: Self-pay | Admitting: General Surgery

## 2020-05-24 DIAGNOSIS — R2231 Localized swelling, mass and lump, right upper limb: Secondary | ICD-10-CM

## 2020-05-24 NOTE — Telephone Encounter (Signed)
Patient aware of results and recommendations. Will contact patient once approved and scheduled.

## 2020-05-24 NOTE — Telephone Encounter (Signed)
[facility]  [place] is nondiagnostic for lipoma. Uncertain etiology at this time as it is intradermal.  Radiology recommended further imaging with CT chest with contrast. Will have to get insurance approval and notify patient.  Attempt to call patient but no answer and no voicemail. Will get office to call patient.   CLINICAL DATA:  Lump on posterior right shoulder  EXAM: ULTRASOUND ABDOMEN LIMITED  COMPARISON:  None.  FINDINGS: Within the right posterior shoulder region, there is a solid soft tissue mass measuring 3.8 x 3.3 x 2.4 cm. This has similar density to the underlying muscle. No cystic components.  IMPRESSION: 3.8 cm solid soft tissue mass in the area concern in the right posterior shoulder region. Sonographic appearance is nonspecific and not diagnostic of a lipoma. This could be further evaluated with chest CT with IV contrast.   Electronically Signed   By: Charlett Nose M.D.   On: 05/24/2020 08:23   Algis Greenhouse, MD [facility] [address] [place], [place] [address] [phone] (office)

## 2020-06-01 DIAGNOSIS — F331 Major depressive disorder, recurrent, moderate: Secondary | ICD-10-CM | POA: Diagnosis not present

## 2020-06-01 DIAGNOSIS — F411 Generalized anxiety disorder: Secondary | ICD-10-CM | POA: Diagnosis not present

## 2020-06-05 ENCOUNTER — Other Ambulatory Visit: Payer: Self-pay

## 2020-06-05 ENCOUNTER — Ambulatory Visit (HOSPITAL_COMMUNITY)
Admission: RE | Admit: 2020-06-05 | Discharge: 2020-06-05 | Disposition: A | Discharge status: Home / Self Care | Payer: Medicare PPO | Source: Ambulatory Visit | Attending: General Surgery | Admitting: General Surgery

## 2020-06-05 DIAGNOSIS — R2231 Localized swelling, mass and lump, right upper limb: Secondary | ICD-10-CM | POA: Diagnosis not present

## 2020-06-05 DIAGNOSIS — I700 Atherosclerosis of aorta: Secondary | ICD-10-CM | POA: Insufficient documentation

## 2020-06-05 DIAGNOSIS — I2510 Atherosclerotic heart disease of native coronary artery without angina pectoris: Secondary | ICD-10-CM | POA: Insufficient documentation

## 2020-06-05 DIAGNOSIS — M47814 Spondylosis without myelopathy or radiculopathy, thoracic region: Secondary | ICD-10-CM | POA: Diagnosis not present

## 2020-06-05 DIAGNOSIS — K760 Fatty (change of) liver, not elsewhere classified: Secondary | ICD-10-CM | POA: Insufficient documentation

## 2020-06-05 DIAGNOSIS — R918 Other nonspecific abnormal finding of lung field: Secondary | ICD-10-CM | POA: Diagnosis not present

## 2020-06-05 LAB — POCT I-STAT CREATININE: Creatinine, Ser: 0.7 mg/dL (ref 0.44–1.00)

## 2020-06-05 MED ORDER — IOHEXOL 300 MG/ML  SOLN
75.0000 mL | Freq: Once | INTRAMUSCULAR | Status: AC | PRN
  Administered 2020-06-05: 75 mL via INTRAVENOUS

## 2020-06-08 ENCOUNTER — Telehealth (INDEPENDENT_AMBULATORY_CARE_PROVIDER_SITE_OTHER): Payer: Medicare PPO | Admitting: General Surgery

## 2020-06-08 DIAGNOSIS — R911 Solitary pulmonary nodule: Secondary | ICD-10-CM

## 2020-06-08 DIAGNOSIS — IMO0001 Reserved for inherently not codable concepts without codable children: Secondary | ICD-10-CM

## 2020-06-08 DIAGNOSIS — R2231 Localized swelling, mass and lump, right upper limb: Secondary | ICD-10-CM

## 2020-06-08 NOTE — Telephone Encounter (Signed)
[facility]   No overt lipoma seen in the right posterior shoulder. She has some asymmetry there but nothing that is concerning.   Will need a CT chest to evaluate the 8X93mm nodule. Will get PCP to order this.   Curlene Labrum, MD [facility] [place], [place] [address] [phone] (office)   CLINICAL DATA:  Soft tissue prominence posterior right chest  EXAM: CT CHEST WITH CONTRAST  TECHNIQUE: Multidetector CT imaging of the chest was performed during intravenous contrast administration. Site of clinical concern marked with BB.  CONTRAST:  27mL OMNIPAQUE IOHEXOL 300 MG/ML  SOLN  COMPARISON:  Chest radiograph January 13, 2020.  FINDINGS: Cardiovascular: No thoracic aortic aneurysm or dissection. Visualized great vessels appear unremarkable. Note that the left vertebral artery arises directly from the aortic arch, an anatomic variant. No major vessel pulmonary embolus appreciable. No pericardial effusion or pericardial thickening. There is aortic atherosclerosis. There are foci of coronary artery calcification.  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 Remaining thyroid appears normal. There are scattered subcentimeter mediastinal lymph nodes. No adenopathy by size criteria evident on this study. No esophageal lesions are evident.  Lungs/Pleura: No edema or airspace opacity. No pleural effusions evident. On axial slice 57 series 4, sagittal slice 60 series 6, and coronal slice 80 series 5, there is a a focal nodular opacity measuring 8 x 8 mm which abuts the pleura in the posterior segment of the right upper lobe. No similar nodular appearing opacities noted elsewhere.  Upper Abdomen: There is hepatic steatosis. Visualized upper abdominal structures otherwise appear normal.  Musculoskeletal: No soft tissue/chest wall masses are evident. In particular, at the  site marked with BB on the skin surface along the posterolateral aspect of the right thorax at the mid scapular level, no mass is evident. There may be slightly more fat in this area on the right compared to the left without well-defined fatty mass.  There are foci of degenerative change in the thoracic spine. No blastic or lytic bone lesions. No bony abnormality seen to account for soft tissue prominence posteriorly on the right.  IMPRESSION: 1. Nodular opacity measuring 8 x 8 mm abutting the pleura in the posterior segment right upper lobe. Non-contrast chest CT at 6-12 months is recommended. If the nodule is stable at time of repeat CT, then future CT at 18-24 months (from today's scan) is considered optional for low-risk patients, but is recommended for high-risk patients. This recommendation follows the consensus statement: Guidelines for Management of Incidental Pulmonary Nodules Detected on CT Images: From the Fleischner Society 2017; Radiology 2017; 284:228-243. Lungs elsewhere clear.  2. No soft tissue mass seen by CT in the area of clinical concern in the posterolateral right thoracic region. There is slight fatty asymmetry in this area without well-defined fatty mass.  3.  No evident adenopathy.  4. Mild aortic atherosclerosis. There are foci of coronary artery calcification.  5. No thyroid lesion. Note that much of the thyroid has been removed.  6.  Hepatic steatosis.  Aortic Atherosclerosis (ICD10-I70.0).   Electronically Signed   By: Lowella Grip III M.D.   On: 06/05/2020 11:20

## 2020-06-13 ENCOUNTER — Ambulatory Visit: Payer: Medicare PPO | Admitting: Physician Assistant

## 2020-06-15 DIAGNOSIS — N390 Urinary tract infection, site not specified: Secondary | ICD-10-CM | POA: Diagnosis not present

## 2020-08-04 DIAGNOSIS — E559 Vitamin D deficiency, unspecified: Secondary | ICD-10-CM | POA: Diagnosis not present

## 2020-08-04 DIAGNOSIS — E079 Disorder of thyroid, unspecified: Secondary | ICD-10-CM | POA: Diagnosis not present

## 2020-08-04 DIAGNOSIS — R7303 Prediabetes: Secondary | ICD-10-CM | POA: Diagnosis not present

## 2020-08-04 DIAGNOSIS — E782 Mixed hyperlipidemia: Secondary | ICD-10-CM | POA: Diagnosis not present

## 2020-08-04 DIAGNOSIS — E039 Hypothyroidism, unspecified: Secondary | ICD-10-CM | POA: Diagnosis not present

## 2020-08-04 DIAGNOSIS — Z6832 Body mass index (BMI) 32.0-32.9, adult: Secondary | ICD-10-CM | POA: Diagnosis not present

## 2020-08-14 DIAGNOSIS — J4520 Mild intermittent asthma, uncomplicated: Secondary | ICD-10-CM | POA: Diagnosis not present

## 2020-08-14 DIAGNOSIS — N905 Atrophy of vulva: Secondary | ICD-10-CM | POA: Diagnosis not present

## 2020-08-14 DIAGNOSIS — R7303 Prediabetes: Secondary | ICD-10-CM | POA: Diagnosis not present

## 2020-08-14 DIAGNOSIS — E039 Hypothyroidism, unspecified: Secondary | ICD-10-CM | POA: Diagnosis not present

## 2020-08-14 DIAGNOSIS — J302 Other seasonal allergic rhinitis: Secondary | ICD-10-CM | POA: Diagnosis not present

## 2020-08-14 DIAGNOSIS — E079 Disorder of thyroid, unspecified: Secondary | ICD-10-CM | POA: Diagnosis not present

## 2020-08-14 DIAGNOSIS — E782 Mixed hyperlipidemia: Secondary | ICD-10-CM | POA: Diagnosis not present

## 2020-08-14 DIAGNOSIS — F331 Major depressive disorder, recurrent, moderate: Secondary | ICD-10-CM | POA: Diagnosis not present

## 2020-08-14 DIAGNOSIS — F411 Generalized anxiety disorder: Secondary | ICD-10-CM | POA: Diagnosis not present

## 2020-10-02 ENCOUNTER — Telehealth: Payer: Self-pay | Admitting: Family Medicine

## 2020-10-02 NOTE — Telephone Encounter (Signed)
Patient called and left a message in regards to the CT that she had in January. She was told that she would need a repeat scan in May. She was calling to ask if she needed to see Dr. Constance Haw again or if her primary physician would be scheduling this.Per Dr. Constance Haw note, patient's PCP should order repeat scan. I called the patient back at [phone] and left a message with her friend that she would need to contact her PCP.

## 2020-10-03 ENCOUNTER — Other Ambulatory Visit: Payer: Self-pay | Admitting: Student

## 2020-10-03 ENCOUNTER — Other Ambulatory Visit (HOSPITAL_COMMUNITY): Payer: Self-pay | Admitting: Student

## 2020-10-03 DIAGNOSIS — R911 Solitary pulmonary nodule: Secondary | ICD-10-CM

## 2020-10-03 NOTE — Telephone Encounter (Signed)
Noted  

## 2020-10-04 ENCOUNTER — Other Ambulatory Visit: Payer: Self-pay

## 2020-10-04 ENCOUNTER — Ambulatory Visit: Payer: Medicare PPO | Admitting: Physician Assistant

## 2020-10-04 ENCOUNTER — Encounter: Payer: Self-pay | Admitting: Physician Assistant

## 2020-10-04 DIAGNOSIS — D2262 Melanocytic nevi of left upper limb, including shoulder: Secondary | ICD-10-CM

## 2020-10-04 DIAGNOSIS — L309 Dermatitis, unspecified: Secondary | ICD-10-CM | POA: Diagnosis not present

## 2020-10-04 DIAGNOSIS — L821 Other seborrheic keratosis: Secondary | ICD-10-CM | POA: Diagnosis not present

## 2020-10-04 DIAGNOSIS — Z86018 Personal history of other benign neoplasm: Secondary | ICD-10-CM

## 2020-10-04 DIAGNOSIS — Z1283 Encounter for screening for malignant neoplasm of skin: Secondary | ICD-10-CM | POA: Diagnosis not present

## 2020-10-04 DIAGNOSIS — Z808 Family history of malignant neoplasm of other organs or systems: Secondary | ICD-10-CM

## 2020-10-04 DIAGNOSIS — D225 Melanocytic nevi of trunk: Secondary | ICD-10-CM | POA: Diagnosis not present

## 2020-10-04 DIAGNOSIS — D485 Neoplasm of uncertain behavior of skin: Secondary | ICD-10-CM

## 2020-10-04 MED ORDER — BETAMETHASONE DIPROPIONATE 0.05 % EX OINT: TOPICAL_OINTMENT | Freq: Every day | CUTANEOUS | 2 refills | Status: DC

## 2020-10-04 NOTE — Patient Instructions (Signed)

## 2020-10-04 NOTE — Progress Notes (Signed)
   New Patient   Subjective  Olivia Arnold is a 70 y.o. female who presents for the following: New Patient (Initial Visit) (Patient here today for full body skin check. Per patient she has a split in her right hand x 1 year that is dry and itching. Patient states that she's used a cortisone cream, antibiotic cream and a cream that her husband uses for itching. Per patient the creams help with the itching. Per patient she has a history of atypical moles. No personal history of melanoma or non mole skin cancer.  Patient's sister did have melanoma and her father had BCC.).   The following portions of the chart were reviewed this encounter and updated as appropriate:      Objective  Well appearing patient in no apparent distress; mood and affect are within normal limits.  A full examination was performed including scalp, head, eyes, ears, nose, lips, neck, chest, axillae, abdomen, back, buttocks, bilateral upper extremities, bilateral lower extremities, hands, feet, fingers, toes, fingernails, and toenails. All findings within normal limits unless otherwise noted below.  Objective  Left Upper Breast: Bichromic dark nested macule.        Objective  Left Shoulder - Posterior: Bichromic dark nested macule.         Assessment & Plan  Neoplasm of uncertain behavior of skin (2) Left Upper Breast  Skin / nail biopsy Type of biopsy: tangential   Informed consent: discussed and consent obtained   Timeout: patient name, date of birth, surgical site, and procedure verified   Procedure prep:  Patient was prepped and draped in usual sterile fashion (Non sterile) Prep type:  Chlorhexidine Anesthesia: the lesion was anesthetized in a standard fashion   Anesthetic:  1% lidocaine w/ epinephrine 1-100,000 local infiltration Instrument used: flexible razor blade   Hemostasis achieved with: aluminum chloride   Outcome: patient tolerated procedure well   Post-procedure details: sterile  dressing applied and wound care instructions given   Dressing type: bandage and petrolatum    Specimen 1 - Surgical pathology Differential Diagnosis: R/O Atypia  Check Margins: No  Left Shoulder - Posterior  Skin / nail biopsy Type of biopsy: tangential   Informed consent: discussed and consent obtained   Timeout: patient name, date of birth, surgical site, and procedure verified   Procedure prep:  Patient was prepped and draped in usual sterile fashion (Non sterile) Prep type:  Chlorhexidine Anesthesia: the lesion was anesthetized in a standard fashion   Anesthetic:  1% lidocaine w/ epinephrine 1-100,000 local infiltration Instrument used: flexible razor blade   Hemostasis achieved with: aluminum chloride   Outcome: patient tolerated procedure well   Post-procedure details: sterile dressing applied and wound care instructions given   Dressing type: petrolatum    Specimen 2 - Surgical pathology Differential Diagnosis: R/O Atypia  Check Margins: No  Dermatitis (2) Right Lower Leg - Anterior; Right Hypothenar [place]  Ordered Medications: betamethasone dipropionate (DIPROLENE) 0.05 % ointment  Discontinue scratching, rubbing and picking the area.   I, Rayelynn Loyal, PA-C, have reviewed all documentation for this visit. The documentation on 10/04/20 for the exam, diagnosis, procedures, and orders are all accurate and complete.

## 2020-10-16 DIAGNOSIS — E039 Hypothyroidism, unspecified: Secondary | ICD-10-CM | POA: Insufficient documentation

## 2020-10-16 DIAGNOSIS — J4520 Mild intermittent asthma, uncomplicated: Secondary | ICD-10-CM | POA: Insufficient documentation

## 2020-11-01 ENCOUNTER — Ambulatory Visit (HOSPITAL_COMMUNITY)
Admission: RE | Admit: 2020-11-01 | Discharge: 2020-11-01 | Disposition: A | Discharge status: Home / Self Care | Payer: Medicare PPO | Source: Ambulatory Visit | Attending: Student | Admitting: Student

## 2020-11-01 ENCOUNTER — Other Ambulatory Visit: Payer: Self-pay

## 2020-11-01 DIAGNOSIS — R911 Solitary pulmonary nodule: Secondary | ICD-10-CM

## 2020-11-01 DIAGNOSIS — I700 Atherosclerosis of aorta: Secondary | ICD-10-CM | POA: Diagnosis not present

## 2020-11-01 DIAGNOSIS — I2510 Atherosclerotic heart disease of native coronary artery without angina pectoris: Secondary | ICD-10-CM | POA: Diagnosis not present

## 2020-12-12 DIAGNOSIS — F332 Major depressive disorder, recurrent severe without psychotic features: Secondary | ICD-10-CM | POA: Diagnosis not present

## 2020-12-12 DIAGNOSIS — Z79891 Long term (current) use of opiate analgesic: Secondary | ICD-10-CM | POA: Diagnosis not present

## 2021-01-03 ENCOUNTER — Other Ambulatory Visit: Payer: Self-pay

## 2021-01-03 ENCOUNTER — Ambulatory Visit
Admission: EM | Admit: 2021-01-03 | Discharge: 2021-01-03 | Disposition: A | Discharge status: Home / Self Care | Payer: Medicare PPO | Attending: Family Medicine | Admitting: Family Medicine

## 2021-01-03 ENCOUNTER — Ambulatory Visit (INDEPENDENT_AMBULATORY_CARE_PROVIDER_SITE_OTHER): Payer: Medicare PPO

## 2021-01-03 ENCOUNTER — Encounter: Payer: Self-pay | Admitting: Emergency Medicine

## 2021-01-03 DIAGNOSIS — J069 Acute upper respiratory infection, unspecified: Secondary | ICD-10-CM | POA: Diagnosis not present

## 2021-01-03 DIAGNOSIS — R062 Wheezing: Secondary | ICD-10-CM | POA: Diagnosis not present

## 2021-01-03 DIAGNOSIS — R0602 Shortness of breath: Secondary | ICD-10-CM

## 2021-01-03 MED ORDER — PREDNISONE 20 MG PO TABS: 40.0000 mg | ORAL_TABLET | Freq: Every day | ORAL | 0 refills | Status: DC

## 2021-01-03 NOTE — ED Triage Notes (Signed)
Sinus congestion started last week.  Started having shortness of breath and wheezing on Thursday of last week.

## 2021-01-03 NOTE — ED Provider Notes (Signed)
[place]   OC:[id number] 01/03/21 Arrival Time: [id number]  ASSESSMENT & PLAN:  1. Viral URI with cough   2. Wheezing    I have personally viewed the imaging studies ordered this visit. CXR: No acute abnormalities. No PNA. Begin trial of: Meds ordered this encounter  Medications   predniSONE (DELTASONE) 20 MG tablet    Sig: Take 2 tablets (40 mg total) by mouth daily.    Dispense:  10 tablet    Refill:  0   Recommend:  Follow-up Information     Celene Squibb, MD.   Specialty: Internal Medicine Why: As needed. Contact information: [place] [address] [facility].   Specialty: Urgent Care Why: If worsening or failing to improve as anticipated. Contact information: [address], [place] [phone]                Reviewed expectations re: course of current medical issues. Questions answered. Outlined signs and symptoms indicating need for more acute intervention. Understanding verbalized. After Visit Summary given.   SUBJECTIVE: History from: patient. Olivia Arnold is a 70 y.o. female who reports nasal congestion, cough beginning last week. Now with wheezing and occas SOB. No CP. Denies: fever. Normal PO intake without n/v/d.  OBJECTIVE:  Vitals:   01/03/21 1117  BP: (!) 152/86  Pulse: (!) 103  Resp: 20  Temp: 98 F (36.7 C)  TempSrc: Oral  SpO2: 92%    General appearance: alert; no distress Eyes: PERRLA; EOMI; conjunctiva normal HENT: [place]; AT; with nasal congestion Neck: supple  Lungs: speaks full sentences without difficulty; unlabored; bilateral exp wheezing present Extremities: no edema Skin: warm and dry Neurologic: normal gait Psychological: alert and cooperative; normal mood and affect   Imaging: DG Chest 2 View  Result Date: 01/03/2021 CLINICAL DATA:  Shortness of breath and wheezing. EXAM: CHEST - 2 VIEW  COMPARISON:  CT chest dated November 01, 2020. Chest x-ray dated January 13, 2020. FINDINGS: The heart size and mediastinal contours are within normal limits. Both lungs are clear. The visualized skeletal structures are unremarkable. IMPRESSION: No active cardiopulmonary disease. Electronically Signed   By: Titus Dubin M.D.   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)   Atypical mole 08/23/2013   Right Post Shoulder, Sup - moderate   Atypical mole 08/23/2013   Right Post Shoulder, Inf - moderate   Atypical mole 08/23/2013   Left Upper Shoulder, Post - mild   Chest pain, unspecified    Depression    Essential (primary) hypertension    Essential hypertension    History of cardiac catheterization    [facility] - normal coronaries   Hypothyroidism    Hypothyroidism, unspecified    Major depressive disorder, single episode, unspecified    Rosacea, unspecified    Unspecified asthma, uncomplicated    Social History   Socioeconomic History   Marital status: Married    Spouse name: Not on file   Number  of children: Not on file   Years of education: Not on file   Highest education level: Not on file  Occupational History   Not on file  Tobacco Use   Smoking status: Former    Packs/day: 1.00    Types: Cigarettes    Start date: 09/20/1968   Smokeless tobacco: Never  Substance and Sexual Activity   Alcohol use: Not Currently    Alcohol/week: 0.0 standard drinks   Drug use: No   Sexual activity: Not on file  Other Topics Concern   Not on file  Social History Narrative   Not on file   Social Determinants of Health   Financial  Resource Strain: Not on file  Food Insecurity: Not on file  Transportation Needs: Not on file  Physical Activity: Not on file  Stress: Not on file  Social Connections: Not on file  Intimate Partner Violence: Not on file   Family History  Problem Relation Age of Onset   Heart attack Brother        Died age 53   CAD Brother        Diagnosed age 4   Aneurysm Mother        Brain   Breast cancer Sister 53   Melanoma Sister    Basal cell carcinoma Father    Past Surgical History:  Procedure Laterality Date   CATARACT EXTRACTION Bilateral 2018   PARATHYROIDECTOMY     THYROIDECTOMY     TONSILLECTOMY       Vanessa Kick, MD 01/03/21 1251

## 2021-01-07 ENCOUNTER — Emergency Department (HOSPITAL_COMMUNITY): Payer: Medicare PPO

## 2021-01-07 ENCOUNTER — Encounter (HOSPITAL_COMMUNITY): Payer: Self-pay

## 2021-01-07 ENCOUNTER — Emergency Department (HOSPITAL_COMMUNITY)
Admission: EM | Admit: 2021-01-07 | Discharge: 2021-01-07 | Disposition: A | Discharge status: Home / Self Care | Payer: Medicare PPO | Attending: Emergency Medicine | Admitting: Emergency Medicine

## 2021-01-07 ENCOUNTER — Other Ambulatory Visit: Payer: Self-pay

## 2021-01-07 DIAGNOSIS — I10 Essential (primary) hypertension: Secondary | ICD-10-CM | POA: Diagnosis not present

## 2021-01-07 DIAGNOSIS — Z79899 Other long term (current) drug therapy: Secondary | ICD-10-CM | POA: Diagnosis not present

## 2021-01-07 DIAGNOSIS — R0602 Shortness of breath: Secondary | ICD-10-CM | POA: Diagnosis not present

## 2021-01-07 DIAGNOSIS — Z87891 Personal history of nicotine dependence: Secondary | ICD-10-CM | POA: Diagnosis not present

## 2021-01-07 DIAGNOSIS — Z20822 Contact with and (suspected) exposure to covid-19: Secondary | ICD-10-CM | POA: Diagnosis not present

## 2021-01-07 DIAGNOSIS — Z7952 Long term (current) use of systemic steroids: Secondary | ICD-10-CM | POA: Diagnosis not present

## 2021-01-07 DIAGNOSIS — E039 Hypothyroidism, unspecified: Secondary | ICD-10-CM | POA: Insufficient documentation

## 2021-01-07 DIAGNOSIS — J4541 Moderate persistent asthma with (acute) exacerbation: Secondary | ICD-10-CM | POA: Diagnosis not present

## 2021-01-07 DIAGNOSIS — J45909 Unspecified asthma, uncomplicated: Secondary | ICD-10-CM | POA: Diagnosis not present

## 2021-01-07 LAB — CBC WITH DIFFERENTIAL/PLATELET
Abs Immature Granulocytes: 0.05 10*3/uL (ref 0.00–0.07)
Basophils Absolute: 0.1 10*3/uL (ref 0.0–0.1)
Basophils Relative: 1 %
Eosinophils Absolute: 0.2 10*3/uL (ref 0.0–0.5)
Eosinophils Relative: 2 %
HCT: 44.3 % (ref 36.0–46.0)
Hemoglobin: 14.5 g/dL (ref 12.0–15.0)
Immature Granulocytes: 0 %
Lymphocytes Relative: 28 %
Lymphs Abs: 3.4 10*3/uL (ref 0.7–4.0)
MCH: 31.1 pg (ref 26.0–34.0)
MCHC: 32.7 g/dL (ref 30.0–36.0)
MCV: 95.1 fL (ref 80.0–100.0)
Monocytes Absolute: 0.9 10*3/uL (ref 0.1–1.0)
Monocytes Relative: 8 %
Neutro Abs: 7.4 10*3/uL (ref 1.7–7.7)
Neutrophils Relative %: 61 %
Platelets: 317 10*3/uL (ref 150–400)
RBC: 4.66 MIL/uL (ref 3.87–5.11)
RDW: 12.1 % (ref 11.5–15.5)
WBC: 12.1 10*3/uL — ABNORMAL HIGH (ref 4.0–10.5)
nRBC: 0 % (ref 0.0–0.2)

## 2021-01-07 LAB — BLOOD GAS, VENOUS
Acid-Base Excess: 2.7 mmol/L — ABNORMAL HIGH (ref 0.0–2.0)
Bicarbonate: 25.6 mmol/L (ref 20.0–28.0)
FIO2: 21
O2 Saturation: 63.2 %
Patient temperature: 36.8
pCO2, Ven: 43.8 mmHg — ABNORMAL LOW (ref 44.0–60.0)
pH, Ven: 7.407 (ref 7.250–7.430)
pO2, Ven: 35.6 mmHg (ref 32.0–45.0)

## 2021-01-07 LAB — BASIC METABOLIC PANEL
Anion gap: 8 (ref 5–15)
BUN: 27 mg/dL — ABNORMAL HIGH (ref 8–23)
CO2: 26 mmol/L (ref 22–32)
Calcium: 9.2 mg/dL (ref 8.9–10.3)
Chloride: 105 mmol/L (ref 98–111)
Creatinine, Ser: 0.68 mg/dL (ref 0.44–1.00)
GFR, Estimated: >60 mL/min (ref >60)
Glucose, Bld: 99 mg/dL (ref 70–99)
Potassium: 3.4 mmol/L — ABNORMAL LOW (ref 3.5–5.1)
Sodium: 139 mmol/L (ref 135–145)

## 2021-01-07 LAB — RESP PANEL BY RT-PCR (FLU A&B, COVID) ARPGX2
Influenza A by PCR: NEGATIVE
Influenza B by PCR: NEGATIVE
SARS Coronavirus 2 by RT PCR: NEGATIVE

## 2021-01-07 LAB — BRAIN NATRIURETIC PEPTIDE: B Natriuretic Peptide: 47 pg/mL (ref 0.0–100.0)

## 2021-01-07 MED ORDER — POTASSIUM CHLORIDE CRYS ER 20 MEQ PO TBCR
40.0000 meq | EXTENDED_RELEASE_TABLET | Freq: Once | ORAL | Status: AC
  Administered 2021-01-07: 40 meq via ORAL
  Filled 2021-01-07: qty 2

## 2021-01-07 MED ORDER — ALBUTEROL (5 MG/ML) CONTINUOUS INHALATION SOLN
10.0000 mg/h | INHALATION_SOLUTION | Freq: Once | RESPIRATORY_TRACT | Status: AC
  Administered 2021-01-07: 10 mg/h via RESPIRATORY_TRACT
  Filled 2021-01-07: qty 20

## 2021-01-07 MED ORDER — MAGNESIUM SULFATE 2 GM/50ML IV SOLN
2.0000 g | Freq: Once | INTRAVENOUS | Status: AC
  Administered 2021-01-07: 2 g via INTRAVENOUS
  Filled 2021-01-07: qty 50

## 2021-01-07 MED ORDER — DEXAMETHASONE SODIUM PHOSPHATE 10 MG/ML IJ SOLN
10.0000 mg | Freq: Once | INTRAMUSCULAR | Status: AC
  Administered 2021-01-07: 10 mg via INTRAVENOUS
  Filled 2021-01-07: qty 1

## 2021-01-07 MED ORDER — ALBUTEROL SULFATE HFA 108 (90 BASE) MCG/ACT IN AERS
2.0000 | INHALATION_SPRAY | RESPIRATORY_TRACT | Status: AC
  Administered 2021-01-07 (×3): 2 via RESPIRATORY_TRACT
  Filled 2021-01-07: qty 6.7

## 2021-01-07 MED ORDER — IPRATROPIUM BROMIDE 0.02 % IN SOLN
0.5000 mg | Freq: Once | RESPIRATORY_TRACT | Status: AC
  Administered 2021-01-07: 0.5 mg via RESPIRATORY_TRACT
  Filled 2021-01-07: qty 2.5

## 2021-01-07 NOTE — ED Triage Notes (Signed)
Pt to er, pt states that she is here for some shortness of breath, pt states that she has a hx of asthma and used her inhaler without relief.  Pt has wet cough.  Pt states that she has had some sob for the past few weeks and has been getting worse.

## 2021-01-07 NOTE — ED Provider Notes (Signed)
[facility] EMERGENCY DEPARTMENT Provider Note   CSN: [id number] Arrival date & time: 01/07/21  1029     History Chief Complaint  Patient presents with   Shortness of [place] is a 70 y.o. female.  HPI     70 year old female comes in with chief complaint of shortness of breath. Patient has history of asthma, anxiety, questionable new Derm neoplasm.  She reports that for the last week she has been having cough, shortness of breath, wheezing.  She went to urgent care and was diagnosed with viral URI.  Allegedly she did not get a COVID-19 test.  Chest x-ray did not show pneumonia so she was not given antibiotics.  Subsequently, patient has had increased cough.  Her cough is producing yellow, thick phlegm.  She feels like most of her cough is still stuck in her throat.  She is having some chest discomfort because of cough.  Review of system is negative for any fevers, chills, body aches.  She also reports that her symptoms are worse when she is laying flat.  No history of CHF, CAD.  There is also no history of PE, DVT.  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)   Atypical mole 08/23/2013   Right Post Shoulder, Sup - moderate   Atypical mole 08/23/2013   Right Post Shoulder, Inf - moderate   Atypical mole 08/23/2013   Left Upper Shoulder, Post - mild   Chest pain, unspecified    Depression    Essential (primary) hypertension    Essential hypertension    History of cardiac catheterization    [facility] July 2000 - normal coronaries   Hypothyroidism    Hypothyroidism, unspecified    Major depressive disorder, single episode, unspecified    Rosacea, unspecified     Unspecified asthma, uncomplicated     Patient Active Problem List   Diagnosis Date Noted   Lung nodule < 6cm on CT 06/08/2020   Lipoma of right shoulder 05/02/2020   Vitamin D deficiency 08/03/2019   Allergies 06/30/2019   Hypothyroidism, unspecified    Major depressive disorder, single episode, unspecified    Anxiety disorder, unspecified    Chest pain, unspecified    Essential (primary) hypertension    Rosacea, unspecified    Unspecified asthma, uncomplicated    Hypothyroidism, adult 09/20/2014   Major depression, single episode 09/20/2014   Anxiety disorder 09/20/2014   ADD (attention deficit disorder) 09/20/2014   Asthma 09/20/2014    Past Surgical History:  Procedure Laterality Date   CATARACT EXTRACTION Bilateral 2018   PARATHYROIDECTOMY     THYROIDECTOMY     TONSILLECTOMY       OB History   No obstetric history on file.     Family History  Problem Relation Age of Onset   Heart attack Brother        Died age 13   CAD Brother        Diagnosed age 11   Aneurysm Mother        Brain   Breast cancer Sister 80   Melanoma Sister    Basal cell carcinoma Father  Social History   Tobacco Use   Smoking status: Former    Packs/day: 1.00    Types: Cigarettes    Start date: 09/20/1968   Smokeless tobacco: Never  Substance Use Topics   Alcohol use: Not Currently    Alcohol/week: 0.0 standard drinks   Drug use: No    Home Medications Prior to Admission medications   Medication Sig Start Date End Date Taking? Authorizing Provider  betamethasone dipropionate (DIPROLENE) 0.05 % ointment Apply topically daily. 10/04/20  Yes Sheffield, Ronalee Red, PA-C  cholecalciferol (VITAMIN D3) 25 MCG (1000 UT) tablet Take 1,000 Units by mouth daily.   Yes [provider]  desvenlafaxine (PRISTIQ) 50 MG 24 hr tablet Take 50 mg by mouth every morning. 09/20/20  Yes [provider]  fluticasone (FLONASE) 50 MCG/ACT nasal spray Place 2 sprays into both nostrils  daily. 08/28/20  Yes [provider]  folic acid (FOLVITE) 1 MG tablet Take 400 mg by mouth daily.    Yes [provider]  hydrochlorothiazide (MICROZIDE) 12.5 MG capsule TAKE 1 CAPSULE(12.5 MG) BY MOUTH DAILY Patient taking differently: Take 12.5 mg by mouth daily. 10/04/19  Yes Corum, Rex Kras, MD  levothyroxine (SYNTHROID) 100 MCG tablet Take 1 tablet (100 mcg total) by mouth daily before breakfast. 06/30/19  Yes Corum, Rex Kras, MD  losartan (COZAAR) 100 MG tablet Take 1 tablet (100 mg total) by mouth daily. 06/30/19  Yes Corum, Rex Kras, MD  montelukast (SINGULAIR) 10 MG tablet Take 1 tablet (10 mg total) by mouth at bedtime. 06/30/19  Yes Corum, Rex Kras, MD  phenazopyridine (PYRIDIUM) 200 MG tablet Take 200 mg by mouth 3 (three) times daily. 06/15/20  Yes [provider]  potassium chloride SA (KLOR-CON) 20 MEQ tablet TAKE 1 TABLET(20 MEQ) BY MOUTH DAILY Patient taking differently: Take 20 mEq by mouth daily. 10/26/19  Yes Corum, Rex Kras, MD  predniSONE (DELTASONE) 20 MG tablet Take 2 tablets (40 mg total) by mouth daily. 01/03/21  Yes Hagler, Aaron Edelman, MD  PREMARIN vaginal cream Place 0.5 Applicatorfuls vaginally 2 (two) times a week. 07/01/19  Yes Corum, Rex Kras, MD    Allergies    Patient has no known allergies.  Review of Systems   Review of Systems  Constitutional:  Positive for activity change. Negative for chills and fever.  Respiratory:  Positive for cough and shortness of breath.   Cardiovascular:  Positive for chest pain.  Allergic/Immunologic: Negative for immunocompromised state.  Hematological:  Does not bruise/bleed easily.  All other systems reviewed and are negative.  Physical Exam Updated Vital Signs BP (!) 183/73   Pulse 78   Temp 98.2 F (36.8 C) (Oral)   Resp 13   Ht '5\' 4"'$  (1.626 m)   Wt 79.4 kg   SpO2 94%   BMI 30.04 kg/m   Physical Exam Vitals and nursing note reviewed.  Constitutional:      Appearance: She is well-developed.  HENT:     Head:  Atraumatic.  Cardiovascular:     Rate and Rhythm: Normal rate.  Pulmonary:     Effort: Pulmonary effort is normal.  Musculoskeletal:     Cervical back: Normal range of motion and neck supple.  Skin:    General: Skin is warm and dry.  Neurological:     Mental Status: She is alert and oriented to person, place, and time.    ED Results / Procedures / Treatments   Labs (all labs ordered are listed, but only abnormal results are  displayed) Labs Reviewed  BASIC METABOLIC PANEL - Abnormal; Notable for the following components:      Result Value   Potassium 3.4 (*)    BUN 27 (*)    All other components within normal limits  CBC WITH DIFFERENTIAL/PLATELET - Abnormal; Notable for the following components:   WBC 12.1 (*)    All other components within normal limits  BLOOD GAS, VENOUS - Abnormal; Notable for the following components:   pCO2, Ven 43.8 (*)    Acid-Base Excess 2.7 (*)    All other components within normal limits  RESP PANEL BY RT-PCR (FLU A&B, COVID) ARPGX2  BRAIN NATRIURETIC PEPTIDE    EKG None  Radiology DG Chest Port 1 View  Result Date: 01/07/2021 CLINICAL DATA:  Short of breath, asthma EXAM: PORTABLE CHEST 1 VIEW COMPARISON:  None. FINDINGS: Normal mediastinum and cardiac silhouette. Normal pulmonary vasculature. No evidence of effusion, infiltrate, or pneumothorax. No acute bony abnormality. IMPRESSION: No acute cardiopulmonary process. Electronically Signed   By: Suzy Bouchard M.D.   On: 01/07/2021 12:39    Procedures .Critical Care  Date/Time: 01/07/2021 3:05 PM Performed by: Varney Biles, MD Authorized by: Varney Biles, MD   Critical care provider statement:    Critical care time (minutes):  36   Critical care was necessary to treat or prevent imminent or life-threatening deterioration of the following conditions:  Respiratory failure   Critical care was time spent personally by me on the following activities:  Discussions with consultants,  evaluation of patient's response to treatment, examination of patient, ordering and performing treatments and interventions, ordering and review of laboratory studies, ordering and review of radiographic studies, pulse oximetry, re-evaluation of patient's condition, obtaining history from patient or surrogate and review of old charts   Medications Ordered in ED Medications  dexamethasone (DECADRON) injection 10 mg (has no administration in time range)  albuterol (PROVENTIL,VENTOLIN) solution continuous neb (has no administration in time range)  ipratropium (ATROVENT) nebulizer solution 0.5 mg (has no administration in time range)  potassium chloride SA (KLOR-CON) CR tablet 40 mEq (has no administration in time range)  magnesium sulfate IVPB 2 g 50 mL (has no administration in time range)  albuterol (VENTOLIN HFA) 108 (90 Base) MCG/ACT inhaler 2 puff (2 puffs Inhalation Given 01/07/21 1347)    ED Course  I have reviewed the triage vital signs and the nursing notes.  Pertinent labs & imaging results that were available during my care of the patient were reviewed by me and considered in my medical decision making (see chart for details).  Clinical Course as of 01/07/21 1506  Sun Jan 07, 2021  1505 Patient reassessed into 2.  Her COVID-19 test is negative.  ABG shows pH of 7.4.  BNP is 47.  X-rays not show any acute infiltrates.  On reassessment at 3 PM, patient continues to have wheezing but it has improved.  We will put her on nebulizer treatment.  She is supposed to get her day 5 of prednisone today.  We will give her IV Decadron here and advised her to take her oral prednisone tomorrow.  Stable for discharge after nebs.  Strict ER return precautions have been discussed, and patient is agreeing with the plan and is comfortable with the workup done and the recommendations from the ER.  At this time, suspicion is that this is all asthma exacerbation and not underlying PE. [AN]     Clinical Course User Index [AN] Varney Biles, MD   MDM Rules/Calculators/A&P  70 year old female comes in with chief complaint of cough, chest pain, shortness of breath and wheezing.  She has history of asthma, but it appears to be relatively well controlled.  Symptoms have been ongoing for more than 7 days now.  Based on history and exam, suspicion is high for asthma exacerbation.  She should get COVID-19 test as that could be the underlying provoking factor.  CHF, PE, CAP, pulmonary edema, pleural effusion also considered in the differential diagnosis.  For now we will start with x-rays, albuterol treatment and reassess. Final Clinical Impression(s) / ED Diagnoses Final diagnoses:  Moderate persistent asthma with acute exacerbation    Rx / DC Orders ED Discharge Orders     None        Varney Biles, MD 01/07/21 1506

## 2021-01-07 NOTE — Discharge Instructions (Addendum)
We saw you in the ER for your asthma related complains. We gave you some breathing treatments in the ER, and seems like your symptoms have improved. Please take albuterol as needed every 4 hours. Please take the medications prescribed. Please refrain from smoking or smoke exposure. Please see a primary care doctor in 1 week. Return to the ER if your symptoms worsen.  

## 2021-01-08 DIAGNOSIS — J069 Acute upper respiratory infection, unspecified: Secondary | ICD-10-CM | POA: Diagnosis not present

## 2021-01-08 DIAGNOSIS — R059 Cough, unspecified: Secondary | ICD-10-CM | POA: Diagnosis not present

## 2021-01-18 DIAGNOSIS — F332 Major depressive disorder, recurrent severe without psychotic features: Secondary | ICD-10-CM | POA: Diagnosis not present

## 2021-02-07 ENCOUNTER — Other Ambulatory Visit: Payer: Self-pay | Admitting: Obstetrics and Gynecology

## 2021-02-07 DIAGNOSIS — Z1231 Encounter for screening mammogram for malignant neoplasm of breast: Secondary | ICD-10-CM

## 2021-03-09 ENCOUNTER — Encounter (HOSPITAL_COMMUNITY): Payer: Self-pay | Admitting: *Deleted

## 2021-03-09 ENCOUNTER — Other Ambulatory Visit: Payer: Self-pay

## 2021-03-09 ENCOUNTER — Emergency Department (HOSPITAL_COMMUNITY): Payer: Medicare PPO

## 2021-03-09 ENCOUNTER — Emergency Department (HOSPITAL_COMMUNITY)
Admission: EM | Admit: 2021-03-09 | Discharge: 2021-03-09 | Disposition: A | Discharge status: Home / Self Care | Payer: Medicare PPO | Attending: Emergency Medicine | Admitting: Emergency Medicine

## 2021-03-09 ENCOUNTER — Encounter: Payer: Self-pay | Admitting: Emergency Medicine

## 2021-03-09 ENCOUNTER — Ambulatory Visit
Admission: EM | Admit: 2021-03-09 | Discharge: 2021-03-09 | Disposition: A | Discharge status: Home / Self Care | Payer: Medicare PPO | Attending: Emergency Medicine | Admitting: Emergency Medicine

## 2021-03-09 DIAGNOSIS — J45901 Unspecified asthma with (acute) exacerbation: Secondary | ICD-10-CM | POA: Insufficient documentation

## 2021-03-09 DIAGNOSIS — I10 Essential (primary) hypertension: Secondary | ICD-10-CM | POA: Insufficient documentation

## 2021-03-09 DIAGNOSIS — R0602 Shortness of breath: Secondary | ICD-10-CM | POA: Diagnosis not present

## 2021-03-09 DIAGNOSIS — J4551 Severe persistent asthma with (acute) exacerbation: Secondary | ICD-10-CM | POA: Diagnosis not present

## 2021-03-09 DIAGNOSIS — Z79899 Other long term (current) drug therapy: Secondary | ICD-10-CM | POA: Diagnosis not present

## 2021-03-09 DIAGNOSIS — E039 Hypothyroidism, unspecified: Secondary | ICD-10-CM | POA: Insufficient documentation

## 2021-03-09 DIAGNOSIS — Z20822 Contact with and (suspected) exposure to covid-19: Secondary | ICD-10-CM | POA: Insufficient documentation

## 2021-03-09 LAB — RESP PANEL BY RT-PCR (FLU A&B, COVID) ARPGX2
Influenza A by PCR: NEGATIVE
Influenza B by PCR: NEGATIVE
SARS Coronavirus 2 by RT PCR: NEGATIVE

## 2021-03-09 MED ORDER — PREDNISONE 50 MG PO TABS
60.0000 mg | ORAL_TABLET | Freq: Once | ORAL | Status: AC
  Administered 2021-03-09: 60 mg via ORAL

## 2021-03-09 MED ORDER — LIDOCAINE HCL (PF) 1 % IJ SOLN
INTRAMUSCULAR | Status: AC
  Administered 2021-03-09: 2.1 mL
  Filled 2021-03-09: qty 5

## 2021-03-09 MED ORDER — GUAIFENESIN-DM 100-10 MG/5ML PO SYRP
5.0000 mL | ORAL_SOLUTION | Freq: Once | ORAL | Status: AC
  Administered 2021-03-09: 5 mL via ORAL
  Filled 2021-03-09: qty 5

## 2021-03-09 MED ORDER — ALBUTEROL SULFATE (2.5 MG/3ML) 0.083% IN NEBU
2.5000 mg | INHALATION_SOLUTION | Freq: Once | RESPIRATORY_TRACT | Status: AC
  Administered 2021-03-09: 2.5 mg via RESPIRATORY_TRACT
  Filled 2021-03-09: qty 3

## 2021-03-09 MED ORDER — AEROCHAMBER PLUS FLO-VU MEDIUM MISC
1.0000 | Freq: Once | Status: AC
  Administered 2021-03-09: 1

## 2021-03-09 MED ORDER — PREDNISONE 20 MG PO TABS: 20.0000 mg | ORAL_TABLET | Freq: Two times a day (BID) | ORAL | 0 refills | Status: AC

## 2021-03-09 MED ORDER — CEFTRIAXONE SODIUM 1 G IJ SOLR
1.0000 g | Freq: Once | INTRAMUSCULAR | Status: AC
  Administered 2021-03-09: 1 g via INTRAMUSCULAR
  Filled 2021-03-09: qty 10

## 2021-03-09 MED ORDER — ALBUTEROL SULFATE HFA 108 (90 BASE) MCG/ACT IN AERS
8.0000 | INHALATION_SPRAY | Freq: Once | RESPIRATORY_TRACT | Status: AC
  Administered 2021-03-09: 8 via RESPIRATORY_TRACT

## 2021-03-09 MED ORDER — IPRATROPIUM-ALBUTEROL 0.5-2.5 (3) MG/3ML IN SOLN
3.0000 mL | Freq: Once | RESPIRATORY_TRACT | Status: AC
  Administered 2021-03-09: 3 mL via RESPIRATORY_TRACT
  Filled 2021-03-09: qty 3

## 2021-03-09 MED ORDER — DOXYCYCLINE HYCLATE 100 MG PO CAPS: 100.0000 mg | ORAL_CAPSULE | Freq: Two times a day (BID) | ORAL | 0 refills | Status: AC

## 2021-03-09 MED ORDER — PREDNISONE 20 MG PO TABS: 40.0000 mg | ORAL_TABLET | Freq: Once | ORAL | Status: DC

## 2021-03-09 NOTE — ED Provider Notes (Signed)
[facility] EMERGENCY DEPARTMENT Provider Note   CSN: [id number] Arrival date & time: 03/09/21  1600     History Chief Complaint  Patient presents with   Shortness of Olivia [place] is a 70 y.o. female.  Patient presents with 4 days of cough and shortness of breath and wheeze.  She states a history of asthma but does not typically have flareups currently.  Symptoms have been progressive the last 4 days, she saw urgent care provider and was sent to the ER for further evaluation.  She states she received prednisone prior to arrival here.  Otherwise denies any headache or chest pain no fever no vomiting or diarrhea reported.      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)   Atypical mole 08/23/2013   Right Post Shoulder, Sup - moderate   Atypical mole 08/23/2013   Right Post Shoulder, Inf - moderate   Atypical mole 08/23/2013   Left Upper Shoulder, Post - mild   Chest pain, unspecified    Depression    Essential (primary) hypertension    Essential hypertension    History of cardiac catheterization    [facility] July 2000 - normal coronaries   Hypothyroidism    Hypothyroidism, unspecified    Major depressive disorder, single episode, unspecified    Rosacea, unspecified    Unspecified asthma, uncomplicated     Patient Active Problem List   Diagnosis Date Noted   Lung nodule < 6cm on CT 06/08/2020   Lipoma of right shoulder 05/02/2020   Vitamin D deficiency 08/03/2019   Allergies 06/30/2019   Hypothyroidism, unspecified    Major depressive disorder, single episode, unspecified    Anxiety disorder, unspecified    Chest pain, unspecified    Essential (primary)  hypertension    Rosacea, unspecified    Unspecified asthma, uncomplicated    Hypothyroidism, adult 09/20/2014   Major depression, single episode 09/20/2014   Anxiety disorder 09/20/2014   ADD (attention deficit disorder) 09/20/2014   Asthma 09/20/2014    Past Surgical History:  Procedure Laterality Date   CATARACT EXTRACTION Bilateral 2018   PARATHYROIDECTOMY     THYROIDECTOMY     TONSILLECTOMY       OB History   No obstetric history on file.     Family History  Problem Relation Age of Onset   Heart attack Brother        Died age 45   CAD Brother        Diagnosed age 8   Aneurysm Mother        Brain   Breast cancer Sister 57   Melanoma Sister    Basal cell carcinoma Father     Social History   Tobacco Use   Smoking status: Former    Packs/day: 1.00    Types: Cigarettes    Start date: 09/20/1968   Smokeless tobacco: Never  Substance Use Topics   Alcohol use: Not Currently    Alcohol/week: 0.0 standard drinks   Drug use: No    Home Medications Prior to Admission medications   Medication Sig Start Date End Date Taking? Authorizing Provider  cholecalciferol (VITAMIN D3) 25 MCG (1000 UT) tablet Take 1,000 Units by mouth daily.   Yes [provider]  doxycycline (VIBRAMYCIN) 100 MG capsule Take 1 capsule (100 mg total) by mouth 2 (two) times daily for 7 days. 03/09/21 03/16/21 Yes Luna Fuse, MD  DULoxetine (CYMBALTA) 20 MG capsule Take 40 mg by mouth daily. 03/05/21  Yes [provider]  fluticasone (FLONASE) 50 MCG/ACT nasal spray Place 2 sprays into both nostrils daily. 08/28/20  Yes [provider]  folic acid (FOLVITE) 1 MG tablet Take 400 mg by mouth daily.    Yes [provider]  hydrochlorothiazide (MICROZIDE) 12.5 MG capsule TAKE 1 CAPSULE(12.5 MG) BY MOUTH DAILY Patient taking differently: Take 12.5 mg by mouth daily. 10/04/19  Yes Corum, Rex Kras, MD  levothyroxine (SYNTHROID) 100 MCG tablet Take 1 tablet (100 mcg  total) by mouth daily before breakfast. 06/30/19  Yes Corum, Rex Kras, MD  losartan (COZAAR) 100 MG tablet Take 1 tablet (100 mg total) by mouth daily. 06/30/19  Yes Corum, Rex Kras, MD  montelukast (SINGULAIR) 10 MG tablet Take 1 tablet (10 mg total) by mouth at bedtime. 06/30/19  Yes Corum, Rex Kras, MD  Omega-3 Fatty Acids (FISH OIL PO) Take 2 capsules by mouth daily.   Yes [provider]  potassium chloride SA (KLOR-CON) 20 MEQ tablet TAKE 1 TABLET(20 MEQ) BY MOUTH DAILY Patient taking differently: Take 20 mEq by mouth daily. 10/26/19  Yes Corum, Rex Kras, MD  predniSONE (DELTASONE) 20 MG tablet Take 1 tablet (20 mg total) by mouth 2 (two) times daily with a meal for 5 days. 03/09/21 03/14/21 Yes Luna Fuse, MD  PREMARIN vaginal cream Place 0.5 Applicatorfuls vaginally 2 (two) times a week. 07/01/19  Yes Corum, Rex Kras, MD  augmented betamethasone dipropionate (DIPROLENE-AF) 0.05 % ointment Apply topically daily. Patient not taking: Reported on 03/09/2021 03/03/21   [provider]  betamethasone dipropionate (DIPROLENE) 0.05 % ointment Apply topically daily. Patient not taking: Reported on 03/09/2021 10/04/20   Warren Danes, PA-C  desvenlafaxine (PRISTIQ) 50 MG 24 hr tablet Take 50 mg by mouth every morning. Patient not taking: Reported on 03/09/2021 09/20/20   [provider]  phenazopyridine (PYRIDIUM) 200 MG tablet Take 200 mg by mouth 3 (three) times daily. Patient not taking: Reported on 03/09/2021 06/15/20   [provider]    Allergies    Patient has no known allergies.  Review of Systems   Review of Systems  Constitutional:  Negative for fever.  HENT:  Negative for ear pain.   Eyes:  Negative for pain.  Respiratory:  Positive for cough and shortness of breath.   Cardiovascular:  Negative for chest pain.  Gastrointestinal:  Negative for abdominal pain.  Genitourinary:  Negative for flank pain.  Musculoskeletal:  Negative for back pain.  Skin:   Negative for rash.  Neurological:  Negative for headaches.   Physical Exam Updated Vital Signs BP (!) 158/65 (BP Location: Left Arm)   Pulse 100   Temp 97.9 F (36.6 C) (Oral)   Resp 13   Ht 5\' 4"  (1.626 m)   Wt 77.1 kg   SpO2 99%   BMI 29.18 kg/m   Physical Exam Constitutional:      General: She is not in acute distress.    Appearance: Normal appearance.  HENT:     Head: Normocephalic.     Nose: Nose normal.  Eyes:     Extraocular Movements: Extraocular movements intact.  Cardiovascular:     Rate and Rhythm: Normal rate.  Pulmonary:     Effort: Tachypnea present.     Breath sounds: Wheezing present.  Musculoskeletal:        General: Normal range of motion.     Cervical back: Normal range of motion.  Neurological:     General: No focal deficit present.     Mental Status: She is alert. Mental status is at baseline.    ED Results / Procedures / Treatments   Labs (all labs ordered are listed, but only abnormal results are displayed) Labs Reviewed  RESP PANEL BY RT-PCR (FLU A&B, COVID) ARPGX2    EKG EKG Interpretation  Date/Time:  Friday March 09 2021 16:40:57 EDT Ventricular Rate:  99 PR Interval:  172 QRS Duration: 94 QT Interval:  361 QTC Calculation: 464 R Axis:   83 Text Interpretation: Sinus rhythm Borderline right axis deviation Confirmed by Thamas Jaegers ([id number]) on 03/09/2021 7:03:59 PM  Radiology DG Chest 2 View  Result Date: 03/09/2021 CLINICAL DATA:  Shortness of breath x4 days worse with exertion. EXAM: CHEST - 2 VIEW COMPARISON:  January 07, 2021. FINDINGS: The heart size and mediastinal contours are within normal limits. Atherosclerotic calcifications of the aortic arch. Mild diffuse bronchial wall thickening. No focal airspace consolidation. No pleural effusion. No pneumothorax. The visualized skeletal structures are unremarkable. IMPRESSION: Mild diffuse bronchial wall thickening, which may reflect chronic bronchitis. No focal airspace  consolidation. Electronically Signed   By: Dahlia Bailiff M.D.   On: 03/09/2021 17:25    Procedures Procedures   Medications Ordered in ED Medications  ipratropium-albuterol (DUONEB) 0.5-2.5 (3) MG/3ML nebulizer solution 3 mL (3 mLs Nebulization Given 03/09/21 1733)  albuterol (PROVENTIL) (2.5 MG/3ML) 0.083% nebulizer solution 2.5 mg (2.5 mg Nebulization Given 03/09/21 1848)  cefTRIAXone (ROCEPHIN) injection 1 g (1 g Intramuscular Given 03/09/21 1922)  albuterol (PROVENTIL) (2.5 MG/3ML) 0.083% nebulizer solution 2.5 mg (2.5 mg Nebulization Given 03/09/21 1930)  lidocaine (PF) (XYLOCAINE) 1 % injection (2.1 mLs  Given 03/09/21 1924)  guaiFENesin-dextromethorphan (ROBITUSSIN DM) 100-10 MG/5ML syrup 5 mL (5 mLs Oral Given 03/09/21 2003)    ED Course  I have reviewed the triage vital signs and the nursing notes.  Pertinent labs & imaging results that were available during my care of the patient were reviewed by me and considered in my medical decision making (see chart for details).    MDM Rules/Calculators/A&P                           Patient has low oxygenation around 90 to 93% on room air.  She was given multiple breathing treatments with improvement to about 93% on room air on average.  Chest x-ray is otherwise unremarkable.  Empirically given Rocephin given her advanced age and symptoms.  Advising outpatient follow-up with her doctor within the week.  We did discuss observation in the hospital, the patient states she has important arrangements to meet this week and states that she feels much better and prefers to follow-up on an outpatient basis.  Advised immediate return if her symptoms worsen or if she has any additional concerns, otherwise discharged home in stable condition.  Final Clinical Impression(s) / ED Diagnoses Final diagnoses:  Moderate asthma with exacerbation, unspecified whether persistent    Rx / DC Orders ED Discharge Orders          Ordered    doxycycline  (VIBRAMYCIN) 100 MG capsule  2 times daily  03/09/21 2023    predniSONE (DELTASONE) 20 MG tablet  2 times daily with meals        03/09/21 2023             Luna Fuse, MD 03/09/21 2024

## 2021-03-09 NOTE — ED Triage Notes (Signed)
Pt is present with SOB, wheezing, nasal/chest congestion,  and coughing. Pt states that her sx started x4 days ago

## 2021-03-09 NOTE — Discharge Instructions (Signed)
Call your primary care doctor or specialist as discussed in the next 2-3 days.   Return immediately back to the ER if:  Your symptoms worsen within the next 12-24 hours. You develop new symptoms such as new fevers, persistent vomiting, new pain, shortness of breath, or new weakness or numbness, or if you have any other concerns.  

## 2021-03-09 NOTE — ED Notes (Signed)
Patient states she went to [facility] and sent here for further evaluation of shortness of breath. Patient states she has been having difficulty breathing over the past 4 days worse with exertion. Was given 60 mg prednisone and 8 albuterol puffs at [facility]. Reports chest discomfort from coughing for past 5 days. Reports a productive cough with clear phlegm.

## 2021-03-09 NOTE — ED Provider Notes (Signed)
HPI  SUBJECTIVE:  Olivia Arnold is a 70 y.o. female who presents with 4 days of chest tightness, cough, chest congestion, wheezing, shortness of breath, dyspnea on exertion starting after she blew leaves without a mask on.  She states this is identical to previous asthma exacerbations.  She denies fevers, body aches, headaches, nasal congestion, rhinorrhea, loss of smell or taste, nausea, vomiting, diarrhea, abdominal pain.  No known flu or COVID exposure.  She got the flu vaccine and the COVID booster.  No antipyretic in the past 6 hours.  She has been trying hot lemon water, decongestants, using her albuterol inhaler 5 times a day and codeine cough medicine at night.  Normally uses albuterol as needed.  She does not have a spacer for her inhaler.  Things are better with rest, worse with exertion.  She has a past medical history of asthma, no recent steroid use or admissions.  She also has a history of hypertension.  No history of coronary disease, CHF.  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)   Atypical mole 08/23/2013   Right Post Shoulder, Sup - moderate   Atypical mole 08/23/2013   Right Post Shoulder, Inf - moderate   Atypical mole 08/23/2013   Left Upper Shoulder, Post - mild   Chest pain, unspecified    Depression    Essential (primary) hypertension    Essential hypertension    History of cardiac catheterization    [facility] July 2000 - normal coronaries   Hypothyroidism    Hypothyroidism, unspecified    Major depressive disorder, single episode, unspecified    Rosacea, unspecified    Unspecified asthma, uncomplicated     Past Surgical History:  Procedure  Laterality Date   CATARACT EXTRACTION Bilateral 2018   PARATHYROIDECTOMY     THYROIDECTOMY     TONSILLECTOMY      Family History  Problem Relation Age of Onset   Heart attack Brother        Died age 98   CAD Brother        Diagnosed age 27   Aneurysm Mother        Brain   Breast cancer Sister 33   Melanoma Sister    Basal cell carcinoma Father     Social History   Tobacco Use   Smoking status: Former    Packs/day: 1.00    Types: Cigarettes    Start date: 09/20/1968   Smokeless tobacco: Never  Substance Use Topics   Alcohol use: Not Currently    Alcohol/week: 0.0 standard drinks   Drug use: No    No current facility-administered medications for this encounter.  Current Outpatient Medications:    betamethasone dipropionate (DIPROLENE) 0.05 % ointment, Apply topically daily., Disp: 50 g, Rfl: 2   cholecalciferol (VITAMIN D3) 25 MCG (1000 UT) tablet, Take 1,000 Units by mouth daily., Disp: , Rfl:    desvenlafaxine (PRISTIQ) 50 MG 24 hr tablet, Take 50 mg by mouth every morning., Disp: , Rfl:    fluticasone (FLONASE) 50 MCG/ACT nasal spray, Place 2 sprays into both nostrils daily., Disp: , Rfl:  folic acid (FOLVITE) 1 MG tablet, Take 400 mg by mouth daily. , Disp: , Rfl:    hydrochlorothiazide (MICROZIDE) 12.5 MG capsule, TAKE 1 CAPSULE(12.5 MG) BY MOUTH DAILY (Patient taking differently: Take 12.5 mg by mouth daily.), Disp: 30 capsule, Rfl: 1   levothyroxine (SYNTHROID) 100 MCG tablet, Take 1 tablet (100 mcg total) by mouth daily before breakfast., Disp: 90 tablet, Rfl: 3   losartan (COZAAR) 100 MG tablet, Take 1 tablet (100 mg total) by mouth daily., Disp: 90 tablet, Rfl: 1   montelukast (SINGULAIR) 10 MG tablet, Take 1 tablet (10 mg total) by mouth at bedtime., Disp: 90 tablet, Rfl: 3   phenazopyridine (PYRIDIUM) 200 MG tablet, Take 200 mg by mouth 3 (three) times daily., Disp: , Rfl:    potassium chloride SA (KLOR-CON) 20 MEQ tablet, TAKE 1 TABLET(20 MEQ) BY MOUTH DAILY  (Patient taking differently: Take 20 mEq by mouth daily.), Disp: 30 tablet, Rfl: 3   predniSONE (DELTASONE) 20 MG tablet, Take 2 tablets (40 mg total) by mouth daily., Disp: 10 tablet, Rfl: 0   PREMARIN vaginal cream, Place 0.5 Applicatorfuls vaginally 2 (two) times a week., Disp: 42.5 g, Rfl: 11  No Known Allergies   ROS  As noted in HPI.   Physical Exam  BP (!) 150/71   Pulse 94   Temp 98 F (36.7 C)   Resp 18   SpO2 93%   Constitutional: Well developed, well nourished, no acute distress slightly dyspneic when talking.  Speaking in full sentences. Eyes:  EOMI, conjunctiva normal bilaterally HENT: Normocephalic, atraumatic,mucus membranes moist Respiratory: Normal inspiratory effort.  Poor air movement.  Diffuse expiratory wheezing throughout all lung fields.  No rales or rhonchi. Cardiovascular: Normal rate, regular rhythm, no murmurs rubs or gallop GI: nondistended skin: No rash, skin intact Musculoskeletal: no deformities Neurologic: Alert & oriented x 3, no focal neuro deficits Psychiatric: Speech and behavior appropriate   ED Course   Medications  predniSONE (DELTASONE) tablet 60 mg (60 mg Oral Given 03/09/21 1440)  albuterol (VENTOLIN HFA) 108 (90 Base) MCG/ACT inhaler 8 puff (8 puffs Inhalation Given 03/09/21 1440)  AeroChamber Plus Flo-Vu Medium MISC 1 each (1 each Other Given 03/09/21 1440)    Orders Placed This Encounter  Procedures   Recheck vitals    Resting O2 sat 20 minutes after giving medications.  Then please do a walking sat    Standing Status:   Standing    Number of Occurrences:   1    No results found for this or any previous visit (from the past 24 hour(s)). No results found.  ED Clinical Impression  1. Severe persistent asthma with acute exacerbation      ED Assessment/Plan  Patient with a moderate to severe asthma exacerbation.  Initial O2 sat 91%.  She does not appear to be in impending respiratory failure.  Giving 8 puffs from an  albuterol inhaler with a spacer and prednisone 60 milligrams.  Will reevaluate and recheck rest/walking O2 sat in about 20 minutes.  Walking O2 sat and peak flow meter not available in this facility.   Repeat O2 sat at rest 93% on room air.  She states that she feels about the same.  Still poor air movement, diffuse wheezing.  Will give another 15 minutes.  Discussed with her possibility of going to the emergency department  Repeat evaluation, oxygen saturation 89%.  She states that she does not feel much better.  Transferring to the ED for more intensive treatment.  She is stable to go by private vehicle.  Discussed medical decision making, rationale for transfer to the emergency department with the patient.  She agrees to go.   Meds ordered this encounter  Medications   predniSONE (DELTASONE) tablet 60 mg   albuterol (VENTOLIN HFA) 108 (90 Base) MCG/ACT inhaler 8 puff   AeroChamber Plus Flo-Vu Medium MISC 1 each      *This clinic note was created using Dragon dictation software. Therefore, there may be occasional mistakes despite careful proofreading.  ?    Melynda Ripple, MD 03/09/21 1545

## 2021-03-09 NOTE — ED Notes (Signed)
Patient is being discharged from the Urgent Care and sent to the Emergency Department via POV . Per Melynda Ripple MD , patient is in need of higher level of care due to Hypoxia. Patient is aware and verbalizes understanding of plan of care.  Vitals:   03/09/21 1420 03/09/21 1523  BP: (!) 150/71   Pulse: 94   Resp: 18   Temp: 98 F (36.7 C)   SpO2: 91% 93%

## 2021-03-09 NOTE — Discharge Instructions (Addendum)
I am concerned that you are having a severe asthma exacerbation.  You have not gotten better after 8 puffs from your albuterol inhaler and 60 mg of prednisone.  I think that you may need more intensive treatment than what can be done here as an outpatient.  Please go to the [facility] emergency department where they will give you additional treatments and make sure that you are safe to go home.

## 2021-03-13 ENCOUNTER — Other Ambulatory Visit: Payer: Self-pay

## 2021-03-13 ENCOUNTER — Ambulatory Visit
Admission: RE | Admit: 2021-03-13 | Discharge: 2021-03-13 | Disposition: A | Discharge status: Home / Self Care | Payer: Medicare PPO | Source: Ambulatory Visit | Attending: Obstetrics and Gynecology | Admitting: Obstetrics and Gynecology

## 2021-03-13 DIAGNOSIS — Z1231 Encounter for screening mammogram for malignant neoplasm of breast: Secondary | ICD-10-CM | POA: Diagnosis not present

## 2021-03-19 DIAGNOSIS — F3341 Major depressive disorder, recurrent, in partial remission: Secondary | ICD-10-CM | POA: Diagnosis not present

## 2021-03-28 ENCOUNTER — Ambulatory Visit (INDEPENDENT_AMBULATORY_CARE_PROVIDER_SITE_OTHER): Payer: Medicare PPO

## 2021-03-28 ENCOUNTER — Ambulatory Visit
Admission: EM | Admit: 2021-03-28 | Discharge: 2021-03-28 | Disposition: A | Discharge status: Home / Self Care | Payer: Medicare PPO | Attending: Urgent Care | Admitting: Urgent Care

## 2021-03-28 ENCOUNTER — Other Ambulatory Visit: Payer: Self-pay

## 2021-03-28 ENCOUNTER — Encounter: Payer: Self-pay | Admitting: Emergency Medicine

## 2021-03-28 DIAGNOSIS — Z87891 Personal history of nicotine dependence: Secondary | ICD-10-CM

## 2021-03-28 DIAGNOSIS — R0902 Hypoxemia: Secondary | ICD-10-CM | POA: Diagnosis not present

## 2021-03-28 DIAGNOSIS — R062 Wheezing: Secondary | ICD-10-CM

## 2021-03-28 DIAGNOSIS — J9691 Respiratory failure, unspecified with hypoxia: Secondary | ICD-10-CM | POA: Diagnosis not present

## 2021-03-28 DIAGNOSIS — J4540 Moderate persistent asthma, uncomplicated: Secondary | ICD-10-CM | POA: Diagnosis not present

## 2021-03-28 DIAGNOSIS — R053 Chronic cough: Secondary | ICD-10-CM

## 2021-03-28 DIAGNOSIS — R059 Cough, unspecified: Secondary | ICD-10-CM | POA: Diagnosis not present

## 2021-03-28 MED ORDER — PROMETHAZINE-DM 6.25-15 MG/5ML PO SYRP: 5.0000 mL | ORAL_SOLUTION | Freq: Every evening | ORAL | 0 refills | Status: DC | PRN

## 2021-03-28 MED ORDER — BENZONATATE 100 MG PO CAPS: 100.0000 mg | ORAL_CAPSULE | Freq: Three times a day (TID) | ORAL | 0 refills | Status: DC | PRN

## 2021-03-28 MED ORDER — PREDNISONE 50 MG PO TABS: 50.0000 mg | ORAL_TABLET | Freq: Every day | ORAL | 0 refills | Status: DC

## 2021-03-28 NOTE — ED Provider Notes (Signed)
[place]   MRN: [id number] DOB: 10-Jul-1950  Subjective:   Olivia Arnold is a 70 y.o. female presenting for 1-2 month history of persistent cough, wheezing, shortness of breath.  Has had multiple office visits for the same.  At her last visit 03/10/2019 through the emergency room, was given an IM injection of ceftriaxone, multiple breathing treatments.  She was also sent home with a prescription for doxycycline and an oral prednisone course.  Has a history of smoking 10 to 25 pack years.  She quit smoking about 20 years ago.  Has an appointment with a pulmonologist coming up.  No current facility-administered medications for this encounter.  Current Outpatient Medications:    augmented betamethasone dipropionate (DIPROLENE-AF) 0.05 % ointment, Apply topically daily. (Patient not taking: Reported on 03/09/2021), Disp: , Rfl:    betamethasone dipropionate (DIPROLENE) 0.05 % ointment, Apply topically daily. (Patient not taking: Reported on 03/09/2021), Disp: 50 g, Rfl: 2   cholecalciferol (VITAMIN D3) 25 MCG (1000 UT) tablet, Take 1,000 Units by mouth daily., Disp: , Rfl:    desvenlafaxine (PRISTIQ) 50 MG 24 hr tablet, Take 50 mg by mouth every morning. (Patient not taking: Reported on 03/09/2021), Disp: , Rfl:    DULoxetine (CYMBALTA) 20 MG capsule, Take 40 mg by mouth daily., Disp: , Rfl:    fluticasone (FLONASE) 50 MCG/ACT nasal spray, Place 2 sprays into both nostrils daily., Disp: , Rfl:    folic acid (FOLVITE) 1 MG tablet, Take 400 mg by mouth daily. , Disp: , Rfl:    hydrochlorothiazide (MICROZIDE) 12.5 MG capsule, TAKE 1 CAPSULE(12.5 MG) BY MOUTH DAILY (Patient taking differently: Take 12.5 mg by mouth daily.), Disp: 30 capsule, Rfl: 1   levothyroxine (SYNTHROID) 100 MCG tablet, Take 1 tablet (100 mcg total) by mouth daily before breakfast., Disp: 90 tablet, Rfl: 3   losartan (COZAAR) 100 MG tablet, Take 1 tablet (100 mg total) by mouth daily., Disp: 90 tablet, Rfl: 1    montelukast (SINGULAIR) 10 MG tablet, Take 1 tablet (10 mg total) by mouth at bedtime., Disp: 90 tablet, Rfl: 3   Omega-3 Fatty Acids (FISH OIL PO), Take 2 capsules by mouth daily., Disp: , Rfl:    phenazopyridine (PYRIDIUM) 200 MG tablet, Take 200 mg by mouth 3 (three) times daily. (Patient not taking: Reported on 03/09/2021), Disp: , Rfl:    potassium chloride SA (KLOR-CON) 20 MEQ tablet, TAKE 1 TABLET(20 MEQ) BY MOUTH DAILY (Patient taking differently: Take 20 mEq by mouth daily.), Disp: 30 tablet, Rfl: 3   PREMARIN vaginal cream, Place 0.5 Applicatorfuls vaginally 2 (two) times a week., Disp: 42.5 g, Rfl: 11   No Known Allergies  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)   Atypical mole 08/23/2013   Right Post Shoulder, Sup - moderate   Atypical mole 08/23/2013   Right Post Shoulder, Inf - moderate   Atypical mole 08/23/2013   Left Upper Shoulder, Post - mild   Chest pain, unspecified    Depression    Essential (primary) hypertension    Essential hypertension    History of cardiac catheterization    [facility] July 2000 - normal  coronaries   Hypothyroidism    Hypothyroidism, unspecified    Major depressive disorder, single episode, unspecified    Rosacea, unspecified    Unspecified asthma, uncomplicated      Past Surgical History:  Procedure Laterality Date   CATARACT EXTRACTION Bilateral 2018   PARATHYROIDECTOMY     THYROIDECTOMY     TONSILLECTOMY      Family History  Problem Relation Age of Onset   Heart attack Brother        Died age 52   CAD Brother        Diagnosed age 90   Aneurysm Mother        Brain   Breast cancer Sister 85   Melanoma Sister    Basal cell  carcinoma Father     Social History   Tobacco Use   Smoking status: Former    Packs/day: 1.00    Types: Cigarettes    Start date: 09/20/1968   Smokeless tobacco: Never  Substance Use Topics   Alcohol use: Not Currently    Alcohol/week: 0.0 standard drinks   Drug use: No    ROS   Objective:   Vitals: BP (!) 168/71 (BP Location: Right Arm)   Pulse (!) 101   Temp 97.9 F (36.6 C) (Oral)   Resp 18   SpO2 92%   Physical Exam Constitutional:      General: She is not in acute distress.    Appearance: Normal appearance. She is well-developed. She is not ill-appearing, toxic-appearing or diaphoretic.  HENT:     Head: Normocephalic and atraumatic.     Nose: Nose normal.     Mouth/Throat:     Mouth: Mucous membranes are moist.  Eyes:     Extraocular Movements: Extraocular movements intact.     Pupils: Pupils are equal, round, and reactive to light.  Cardiovascular:     Rate and Rhythm: Normal rate and regular rhythm.     Pulses: Normal pulses.     Heart sounds: Normal heart sounds. No murmur heard.   No friction rub. No gallop.  Pulmonary:     Effort: Pulmonary effort is normal. No respiratory distress.     Breath sounds: No stridor. Wheezing and rhonchi present. No rales.     Comments: No use of accessory muscles, pursed or cyanotic lips. Skin:    General: Skin is warm and dry.     Findings: No rash.  Neurological:     Mental Status: She is alert and oriented to person, place, and time.  Psychiatric:        Mood and Affect: Mood normal.        Behavior: Behavior normal.        Thought Content: Thought content normal.    DG Chest 2 View  Result Date: 03/28/2021 CLINICAL DATA:  Cough and hypoxia EXAM: CHEST - 2 VIEW COMPARISON:  03/09/2021 FINDINGS: Mild hyperinflation. Midline trachea. Normal heart size. Atherosclerosis in the transverse aorta. No pleural effusion or pneumothorax. Mild pulmonary interstitial thickening. No lobar consolidation. IMPRESSION: No acute  cardiopulmonary disease. Hyperinflation and mild central airway thickening, likely related to sequelae of smoking/chronic bronchitis and/or asthma. Aortic Atherosclerosis (ICD10-I70.0). Electronically Signed   By: Abigail Miyamoto M.D.   On: 03/28/2021 10:17     DG Chest 2 View  Result Date: 03/09/2021 CLINICAL DATA:  Shortness of breath x4 days worse with exertion. EXAM: CHEST - 2 VIEW COMPARISON:  January 07, 2021. FINDINGS: The heart size and mediastinal contours are within normal  limits. Atherosclerotic calcifications of the aortic arch. Mild diffuse bronchial wall thickening. No focal airspace consolidation. No pleural effusion. No pneumothorax. The visualized skeletal structures are unremarkable. IMPRESSION: Mild diffuse bronchial wall thickening, which may reflect chronic bronchitis. No focal airspace consolidation. Electronically Signed   By: Dahlia Bailiff M.D.   On: 03/09/2021 17:25   DG Chest 2 View  Result Date: 01/03/2021 CLINICAL DATA:  Shortness of breath and wheezing. EXAM: CHEST - 2 VIEW COMPARISON:  CT chest dated November 01, 2020. Chest x-ray dated January 13, 2020. FINDINGS: The heart size and mediastinal contours are within normal limits. Both lungs are clear. The visualized skeletal structures are unremarkable. IMPRESSION: No active cardiopulmonary disease. Electronically Signed   By: Titus Dubin M.D.   On: 01/03/2021 11:56   DG Chest Port 1 View  Result Date: 01/07/2021 CLINICAL DATA:  Short of breath, asthma EXAM: PORTABLE CHEST 1 VIEW COMPARISON:  None. FINDINGS: Normal mediastinum and cardiac silhouette. Normal pulmonary vasculature. No evidence of effusion, infiltrate, or pneumothorax. No acute bony abnormality. IMPRESSION: No acute cardiopulmonary process. Electronically Signed   By: Suzy Bouchard M.D.   On: 01/07/2021 12:39   MM 3D SCREEN BREAST BILATERAL  Result Date: 03/16/2021 CLINICAL DATA:  Screening. EXAM: DIGITAL SCREENING BILATERAL MAMMOGRAM WITH TOMOSYNTHESIS AND  CAD TECHNIQUE: Bilateral screening digital craniocaudal and mediolateral oblique mammograms were obtained. Bilateral screening digital breast tomosynthesis was performed. The images were evaluated with computer-aided detection. COMPARISON:  Previous exam(s). ACR Breast Density Category b: There are scattered areas of fibroglandular density. FINDINGS: There are no findings suspicious for malignancy. IMPRESSION: No mammographic evidence of malignancy. A result letter of this screening mammogram will be mailed directly to the patient. RECOMMENDATION: Screening mammogram in one year. (Code:SM-B-01Y) BI-RADS CATEGORY  1: Negative. Electronically Signed   By: Ileana Roup M.D.   On: 03/16/2021 16:24     Assessment and Plan :   PDMP not reviewed this encounter.  1. Moderate persistent asthma without complication   2. Persistent cough for 3 weeks or longer   3. Wheezing   4. History of smoking 10-25 pack years    Patient may have developed chronic bronchitis, recommended keeping her appointment with the pulmonologist coming up.  We will use 1 more round of steroids of 50 mg once daily.  Schedule albuterol inhaler.  Given negative chest x-ray results, will avoid further use of antibiotics. Counseled patient on potential for adverse effects with medications prescribed/recommended today, ER and return-to-clinic precautions discussed, patient verbalized understanding.    Jaynee Eagles, PA-C 03/28/21 1030

## 2021-03-28 NOTE — ED Triage Notes (Signed)
Sates she woke up this morning with wheezing.  States albuterol inhaler is the only thing that helps.  Some nasal congestion.  States after taking prednisone a month ago, her symptoms cleared up and now they are back.

## 2021-03-29 ENCOUNTER — Ambulatory Visit: Payer: Medicare PPO

## 2021-03-29 ENCOUNTER — Ambulatory Visit (INDEPENDENT_AMBULATORY_CARE_PROVIDER_SITE_OTHER): Payer: Medicare PPO | Admitting: Internal Medicine

## 2021-03-29 ENCOUNTER — Encounter: Payer: Self-pay | Admitting: Internal Medicine

## 2021-03-29 DIAGNOSIS — J45909 Unspecified asthma, uncomplicated: Secondary | ICD-10-CM

## 2021-03-29 MED ORDER — PANTOPRAZOLE SODIUM 40 MG PO TBEC: 40.0000 mg | DELAYED_RELEASE_TABLET | Freq: Every day | ORAL | 2 refills | Status: DC

## 2021-03-29 NOTE — Assessment & Plan Note (Addendum)
Onset early aug 2022 with neg covid testing/ husband with similar illness - cyclical cough rx 75/01/1637 >>>   Of the three most common causes of  Sub-acute / recurrent or chronic cough, only one (GERD)  can actually contribute to/ trigger  the other two (asthma and post nasal drip syndrome)  and perpetuate the cylce of cough.  While not intuitively obvious, many patients with chronic low grade reflux do not cough until there is a primary insult that disturbs the protective epithelial barrier and exposes sensitive nerve endings.   This is typically viral but can due to PNDS and  either may apply here.    >>>  The point is that once this occurs, it is difficult to eliminate the cycle  using anything but a maximally effective acid suppression regimen at least in the short run, accompanied by an appropriate diet to address non acid GERD and control / eliminate pnds with 1st gen H1 blockers per guidelines  and the cough itself for at least 3 days with mucinex/ phenergan dm and tessilon and if still can't stop coughing rx with tramadol >>> also so added 6 day taper off  Prednisone starting at 40 mg per day in case of component of Th-2 driven upper or lower airways inflammation (if cough responds short term only to relapse before return while will on full rx for uacs (as above), then  that would point to allergic rhinitis/ asthma or eos bronchitis as alternative dx)          Each maintenance medication was reviewed in detail including emphasizing most importantly the difference between maintenance and prns and under what circumstances the prns are to be triggered using an action plan format where appropriate.  Total time for H and P, chart review, counseling, reviewing hfa device(s) and generating customized AVS unique to this new pt office visit / same day charting  > 45 min

## 2021-03-29 NOTE — Progress Notes (Signed)
Olivia Arnold, female    DOB: February 19, 1951,   MRN: [id number]   Brief patient profile:  4 yowf quit smoking around 1990 with a background  of seaonal rhinitis in fall shots x 2 years helped and never needed inhalers  self-referred to pulmonary clinic in [place]  03/29/2021  with acute illness early 12/2020 she shared with her husband sob, cough fatigue neg covid 19 testing > ER /UC x only better p inhaler only 50 %      History of Present Illness  03/29/2021  Pulmonary/ 1st office eval/ [facility] / [facility]  Chief Complaint  Patient presents with   Consult    Suspected COPD   3 h last albuterol/ and pred 50 mg am of ov  Dyspnea:  50% flat and slow but hills are tought Cough: flat bed/ on side does better on L side down lots of mucoid sputum Sleep: up coughing most nights SABA use: seems to help some   No obvious day to day or daytime variability or assoc  mucus plugs or hemoptysis or cp or chest tightness, subjective wheeze or overt sinus or hb symptoms.    Also denies any obvious fluctuation of symptoms with weather or environmental changes or other aggravating or alleviating factors except as outlined above   No unusual exposure hx or h/o childhood pna/ asthma or knowledge of premature birth.  Current Allergies, Complete Past Medical History, Past Surgical History, Family History, and Social History were reviewed in [employer] record.  ROS  The following are not active complaints unless bolded Hoarseness, sore throat, dysphagia, dental problems, itching, sneezing,  nasal congestion or discharge of excess mucus or purulent secretions, ear ache,   fever, chills, sweats, unintended wt loss or wt gain, classically pleuritic or exertional cp,  orthopnea pnd or arm/hand swelling  or leg swelling, presyncope, palpitations, abdominal pain, anorexia, nausea, vomiting, diarrhea  or change in bowel habits or change in bladder habits, change in stools or change in  urine, dysuria, hematuria,  rash, arthralgias, visual complaints, headache, numbness, weakness or ataxia or problems with walking or coordination,  change in mood or  memory.           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)   Atypical mole 08/23/2013   Right Post Shoulder, Sup - moderate   Atypical mole 08/23/2013   Right Post Shoulder, Inf - moderate   Atypical mole 08/23/2013   Left Upper Shoulder, Post - mild   Chest pain, unspecified    Depression    Essential (primary) hypertension    Essential hypertension    History of cardiac catheterization    [facility] July 2000 - normal coronaries   Hypothyroidism    Hypothyroidism, unspecified    Major depressive disorder, single episode, unspecified    Rosacea, unspecified    Unspecified asthma, uncomplicated     Outpatient Medications Prior to Visit  Medication Sig Dispense Refill   augmented betamethasone dipropionate (DIPROLENE-AF) 0.05 % ointment Apply topically daily.     cholecalciferol (VITAMIN D3) 25 MCG (1000 UT) tablet Take 1,000 Units by mouth daily.  desvenlafaxine (PRISTIQ) 50 MG 24 hr tablet Take 50 mg by mouth every morning.     DULoxetine (CYMBALTA) 20 MG capsule Take 40 mg by mouth daily.     fluticasone (FLONASE) 50 MCG/ACT nasal spray Place 2 sprays into both nostrils daily.     folic acid (FOLVITE) 1 MG tablet Take 400 mg by mouth daily.      hydrochlorothiazide (MICROZIDE) 12.5 MG capsule TAKE 1 CAPSULE(12.5 MG) BY MOUTH DAILY (Patient taking differently: Take 12.5 mg by mouth daily.) 30 capsule 1   levothyroxine (SYNTHROID) 100 MCG tablet Take 1 tablet (100 mcg total) by mouth daily before breakfast. 90 tablet  3   losartan (COZAAR) 100 MG tablet Take 1 tablet (100 mg total) by mouth daily. 90 tablet 1   montelukast (SINGULAIR) 10 MG tablet Take 1 tablet (10 mg total) by mouth at bedtime. 90 tablet 3   Omega-3 Fatty Acids (FISH OIL PO) Take 2 capsules by mouth daily.     potassium chloride SA (KLOR-CON) 20 MEQ tablet TAKE 1 TABLET(20 MEQ) BY MOUTH DAILY (Patient taking differently: Take 20 mEq by mouth daily.) 30 tablet 3   predniSONE (DELTASONE) 50 MG tablet Take 1 tablet (50 mg total) by mouth daily with breakfast. 5 tablet 0   PREMARIN vaginal cream Place 0.5 Applicatorfuls vaginally 2 (two) times a week. 42.5 g 11   promethazine-dextromethorphan (PROMETHAZINE-DM) 6.25-15 MG/5ML syrup Take 5 mLs by mouth at bedtime as needed for cough. 100 mL 0   benzonatate (TESSALON) 100 MG capsule Take 1-2 capsules (100-200 mg total) by mouth 3 (three) times daily as needed for cough. (Patient not taking: Reported on 03/29/2021) 60 capsule 0   betamethasone dipropionate (DIPROLENE) 0.05 % ointment Apply topically daily. 50 g 2   phenazopyridine (PYRIDIUM) 200 MG tablet Take 200 mg by mouth 3 (three) times daily.     No facility-administered medications prior to visit.     Objective:     BP 138/78 (BP Location: Left Arm, Patient Position: Sitting)   Pulse 81   Temp 98.8 F (37.1 C) (Temporal)   Wt 173 lb 12.8 oz (78.8 kg)   SpO2 99% Comment: ra  BMI 29.83 kg/m   SpO2: 99 % (ra)  Amb wf nad nad cough at end exp    HEENT : pt wearing mask not removed for exam due to covid -19 concerns.    NECK :  without JVD/Nodes/TM/ nl carotid upstrokes bilaterally   LUNGS: no acc muscle use,  Nl contour chest which is clear to A and P bilaterally without cough on insp or exp maneuvers   CV:  RRR  no s3 or murmur or increase in P2, and no edema   ABD:  soft and nontender with nl inspiratory excursion in the supine position. No bruits or organomegaly appreciated, bowel sounds nl  MS:  Nl gait/ ext warm  without deformities, calf tenderness, cyanosis or clubbing No obvious joint restrictions   SKIN: warm and dry without lesions    NEURO:  alert, approp, nl sensorium with  no motor or cerebellar deficits apparent.         I personally reviewed images and agree with radiology impression as follows:   Chest CT w/o contrast 11/01/20 Lungs are adequately inflated without airspace consolidation or effusion. Essentially stable pleural based focus of nodularity over the lateral right upper lobe currently measuring 6 mm in greatest dimension (previously 8 mm). No new nodules identified. Airways are normal.   I personally reviewed  images and agree with radiology impression as follows:  CXR:   pa and lateral 03/28/21  No acute cardiopulmonary disease.  Hyperinflation and mild central airway thickening, likely related to sequelae of smoking/chronic bronchitis and/or asthma.   Assessment   Acute asthmatic bronchitis Onset early aug 2022 with neg covid testing/ husband with similar illness - cyclical cough rx 97/10/7339 >>>   Of the three most common causes of  Sub-acute / recurrent or chronic cough, only one (GERD)  can actually contribute to/ trigger  the other two (asthma and post nasal drip syndrome)  and perpetuate the cylce of cough.  While not intuitively obvious, many patients with chronic low grade reflux do not cough until there is a primary insult that disturbs the protective epithelial barrier and exposes sensitive nerve endings.   This is typically viral but can due to PNDS and  either may apply here.    >>>  The point is that once this occurs, it is difficult to eliminate the cycle  using anything but a maximally effective acid suppression regimen at least in the short run, accompanied by an appropriate diet to address non acid GERD and control / eliminate pnds with 1st gen H1 blockers per guidelines  and the cough itself for at least 3 days with mucinex/ phenergan dm and tessilon  and if still can't stop coughing rx with tramadol >>> also so added 6 day taper off  Prednisone starting at 40 mg per day in case of component of Th-2 driven upper or lower airways inflammation (if cough responds short term only to relapse before return while will on full rx for uacs (as above), then  that would point to allergic rhinitis/ asthma or eos bronchitis as alternative dx)          Each maintenance medication was reviewed in detail including emphasizing most importantly the difference between maintenance and prns and under what circumstances the prns are to be triggered using an action plan format where appropriate.  Total time for H and P, chart review, counseling, reviewing hfa device(s) and generating customized AVS unique to this new pt office visit / same day charting  > 45 min            Christinia Gully, MD 03/29/2021

## 2021-03-29 NOTE — Patient Instructions (Addendum)
The key to effective treatment for your cough is eliminating the non-stop cycle of cough you're stuck in long enough to let your airway heal completely and then see if there is anything still making you cough once you stop the cough suppression, but this should take no more than 5 days to figure out  Mucinex 1200 mg every 12 hours and supplement with phenergan dm if can afford to be sleepy and if not sleepy then ok to add on tessilon   Late add:  advised to finish prednisone rx   Protonix (pantoprazole) Take 30-60 min before first meal of the day and Pepcid 20 mg one  hour before bedtime plus chlorpheniramine 4 mg x 1-2   (both available over the counter)  until cough is completely gone for at least a week without the need for cough suppression  GERD (REFLUX)  is an extremely common cause of respiratory symptoms, many times with no significant heartburn at all.    It can be treated with medication, but also with lifestyle changes including avoidance of late meals, excessive alcohol, smoking cessation, and avoid fatty foods, chocolate, peppermint, colas, red wine, and acidic juices such as orange juice.  NO MINT OR MENTHOL PRODUCTS SO NO COUGH DROPS  USE HARD CANDY INSTEAD (jolley ranchers or Stover's or Lifesavers (all available in sugarless versions) NO OIL BASED VITAMINS - use powdered substitutes.  For shortness of breath ok to use albuterol up to every 4 hours but the goal is less than twice weekly    Call if not a lot better by 04/02/21 please call, follow up is as needed

## 2021-04-26 ENCOUNTER — Institutional Professional Consult (permissible substitution): Payer: Medicare PPO | Admitting: Internal Medicine

## 2021-05-31 DIAGNOSIS — F3341 Major depressive disorder, recurrent, in partial remission: Secondary | ICD-10-CM | POA: Diagnosis not present

## 2021-06-04 DIAGNOSIS — Z6831 Body mass index (BMI) 31.0-31.9, adult: Secondary | ICD-10-CM | POA: Diagnosis not present

## 2021-06-04 DIAGNOSIS — N870 Mild cervical dysplasia: Secondary | ICD-10-CM | POA: Diagnosis not present

## 2021-06-04 DIAGNOSIS — Z01419 Encounter for gynecological examination (general) (routine) without abnormal findings: Secondary | ICD-10-CM | POA: Diagnosis not present

## 2021-06-04 DIAGNOSIS — N952 Postmenopausal atrophic vaginitis: Secondary | ICD-10-CM | POA: Diagnosis not present

## 2021-06-04 DIAGNOSIS — Z01411 Encounter for gynecological examination (general) (routine) with abnormal findings: Secondary | ICD-10-CM | POA: Diagnosis not present

## 2021-06-04 DIAGNOSIS — Z124 Encounter for screening for malignant neoplasm of cervix: Secondary | ICD-10-CM | POA: Diagnosis not present

## 2021-07-02 DIAGNOSIS — F3341 Major depressive disorder, recurrent, in partial remission: Secondary | ICD-10-CM | POA: Diagnosis not present

## 2021-07-31 ENCOUNTER — Other Ambulatory Visit: Payer: Self-pay

## 2021-07-31 ENCOUNTER — Ambulatory Visit: Payer: Medicare PPO | Admitting: Primary Care

## 2021-07-31 ENCOUNTER — Encounter: Payer: Self-pay | Admitting: Primary Care

## 2021-07-31 ENCOUNTER — Ambulatory Visit (INDEPENDENT_AMBULATORY_CARE_PROVIDER_SITE_OTHER): Payer: Medicare PPO

## 2021-07-31 VITALS — BP 138/78 | HR 96 | Temp 97.9°F | Ht 64.0 in | Wt 179.0 lb

## 2021-07-31 DIAGNOSIS — F410 Panic disorder [episodic paroxysmal anxiety] without agoraphobia: Secondary | ICD-10-CM | POA: Diagnosis not present

## 2021-07-31 DIAGNOSIS — F411 Generalized anxiety disorder: Secondary | ICD-10-CM | POA: Diagnosis not present

## 2021-07-31 DIAGNOSIS — J45909 Unspecified asthma, uncomplicated: Secondary | ICD-10-CM

## 2021-07-31 DIAGNOSIS — F332 Major depressive disorder, recurrent severe without psychotic features: Secondary | ICD-10-CM | POA: Diagnosis not present

## 2021-07-31 MED ORDER — METHYLPREDNISOLONE ACETATE 80 MG/ML IJ SUSP
80.0000 mg | Freq: Once | INTRAMUSCULAR | Status: AC
  Administered 2021-07-31: 80 mg via INTRAMUSCULAR

## 2021-07-31 MED ORDER — PROMETHAZINE-DM 6.25-15 MG/5ML PO SYRP: 5.0000 mL | ORAL_SOLUTION | Freq: Four times a day (QID) | ORAL | 0 refills | Status: DC | PRN

## 2021-07-31 MED ORDER — ALBUTEROL SULFATE HFA 108 (90 BASE) MCG/ACT IN AERS: 2.0000 | INHALATION_SPRAY | Freq: Four times a day (QID) | RESPIRATORY_TRACT | 3 refills | Status: DC | PRN

## 2021-07-31 MED ORDER — PREDNISONE 10 MG PO TABS: ORAL_TABLET | ORAL | 0 refills | Status: DC

## 2021-07-31 NOTE — Progress Notes (Signed)
Please let patient know CXR was normal. She had some coarsed lung markings which appear chronic, we may want to evaluate to PFTs and CT imaging at follow-up. No acute pneumonia or bronchitis. Abx not indicated

## 2021-07-31 NOTE — Patient Instructions (Addendum)
Recommendations: ?- Start prednisone taper tomorrow  ?- Start either over the counter Claritin or Zyrtec once daily  ?- Use albuterol every 6 hours for shortness of breath/wheezing for the next 1-2 weeks ?- Continue Singulair, Flonase and Protonix as directed ?- Take promethazine-DM 58m every 6 hours for cough ? ?Orders: ?- CXR today re: asthma flare (order) ? ?Rx: ?- Albuterol inhaler  ?- Prednisone taper  ? ?In office treatment: ?- Depo medrol '80mg'$  IM x 1 ? ?Follow-up: ?- 2 weeks with Beth Np or sooner if needed  ?

## 2021-07-31 NOTE — Assessment & Plan Note (Signed)
-   Patient develop cough with associated chest tightness and wheezing 2-3 weeks ago. She has wheezing on exam. Received depo-medrol '80mg'$  IM x1 in office. Checking CXR today. Sending in prednisone taper and refilling albuterol hfa to use every 6 hours for shortness of breath/wheezing along with promethazine-DM 20m every 6 hours for cough. Continue Singulair, Flonase and Protonix as directed. Start OTC antihistamine for seasonal allergies. FU in 2 weeks with Beth Np or sooner if needed. If not better consider starting return bronchodilator regimen.  ?

## 2021-07-31 NOTE — Progress Notes (Signed)
? ?'@Patient'$  ID: Olivia Arnold, female    DOB: [birth year], 71 y.o.   MRN: [id number] ? ?Chief Complaint  ?Patient presents with  ? Acute Visit  ?  Asthma flair up. Started 2 weeks ago per patient. Pt states one day it can be ok and then the next day its like she cant get to inhaler fast enough.   ? ? ?Referring provider: ?Celene Squibb, MD ? ?HPI: ?71 year old female, former smoker.  Past medical history significant for asthma, hypertension, hypothyroidism, ADD, anxiety/depression, vitamin D deficiency.  Of Dr. Melvyn Novas, seen for initial consult on 03/29/2021 for asthmatic bronchitis. ? ?07/31/2021 ?Patient presents today for acute office visit due to asthma flare. She complains of cough, wheezing and chest tightness. Symptoms started 2-3 weeks ago. She has not been on prednisone in several months. Her asthma worsened after virial respiratory infection in August 2022. Since she was last seen she was doing well. States that her cough got better. She is still taking Singulair, flonase, Protonix daily. She is not prescribed a maintenance inhaler. She has been using albuterol 2-3 times a day the last couple of weeks. Last breathing test was in 2016.  ? ? ?No Known Allergies ? ?Immunization History  ?Administered Date(s) Administered  ? Influenza,inj,Quad PF,6+ Mos 04/12/2019  ? Tdap 08/17/2012, 04/12/2019  ? ? ?Past Medical History:  ?Diagnosis Date  ? ADHD (attention deficit hyperactivity disorder)   ? Anxiety   ? Anxiety disorder, unspecified   ? Asthma   ? Atypical mole 11/09/2003  ? Right Scapula - minimal  ? Atypical mole 10/10/2008  ? Right Mid Paraspinal - slight to moderate   ? Atypical mole 12/06/2008  ? 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 Atypical mole 08/23/2013  ? Right Post Shoulder, Sup - moderate  ? Atypical mole 08/23/2013  ? Right Post Shoulder, Inf - moderate  ? Atypical mole 08/23/2013  ? Left Upper Shoulder, Post - mild  ? Chest pain, unspecified   ? Depression   ? Essential (primary) hypertension    ? Essential hypertension   ? History of cardiac catheterization   ? [facility] July 2000 - normal coronaries  ? Hypothyroidism   ? Hypothyroidism, unspecified   ? Major depressive disorder, single episode, unspecified   ? Rosacea, unspecified   ? Unspecified asthma, uncomplicated   ? ? ?Tobacco History: ?Social History  ? ?Tobacco Use  ?Smoking Status Former  ? Packs/day: 1.00  ? Types: Cigarettes  ? Start date: 09/20/1968  ?Smokeless Tobacco Never  ? ?Counseling given: Not Answered ? ? ?Outpatient Medications Prior to Visit  ?Medication Sig Dispense Refill  ? augmented betamethasone dipropionate (DIPROLENE-AF) 0.05 % ointment Apply topically daily.    ? benzonatate (TESSALON) 100 MG capsule Take 1-2 capsules (100-200 mg total) by mouth 3 (three) times daily as needed for cough. 60 capsule 0  ? cholecalciferol (VITAMIN D3) 25 MCG (1000 UT) tablet Take 1,000 Units by mouth daily.    ? DULoxetine (CYMBALTA) 20 MG capsule Take 60 mg by mouth daily.    ? fluticasone (FLONASE) 50 MCG/ACT nasal spray Place 2 sprays into both nostrils daily.    ? folic acid (FOLVITE) 1 MG tablet Take 400 mg by mouth daily.     ? hydrochlorothiazide (MICROZIDE) 12.5 MG capsule TAKE 1 CAPSULE(12.5 MG) BY MOUTH DAILY (Patient taking differently: Take 12.5 mg by mouth daily.) 30 capsule 1  ? levothyroxine (SYNTHROID) 100 MCG tablet Take 1 tablet (100 mcg total) by mouth daily  before breakfast. 90 tablet 3  ? losartan (COZAAR) 100 MG tablet Take 1 tablet (100 mg total) by mouth daily. 90 tablet 1  ? montelukast (SINGULAIR) 10 MG tablet Take 1 tablet (10 mg total) by mouth at bedtime. 90 tablet 3  ? Omega-3 Fatty Acids (FISH OIL PO) Take 2 capsules by mouth daily.    ? pantoprazole (PROTONIX) 40 MG tablet Take 1 tablet (40 mg total) by mouth daily. Take 30-60 min before first meal of the day 30 tablet 2  ? potassium chloride SA (KLOR-CON) 20 MEQ tablet TAKE 1 TABLET(20 MEQ) BY MOUTH DAILY (Patient taking differently: Take 20 mEq by mouth daily.) 30  tablet 3  ? PREMARIN vaginal cream Place 0.5 Applicatorfuls vaginally 2 (two) times a week. 42.5 g 11  ? desvenlafaxine (PRISTIQ) 50 MG 24 hr tablet Take 50 mg by mouth every morning.    ? predniSONE (DELTASONE) 50 MG tablet Take 1 tablet (50 mg total) by mouth daily with breakfast. 5 tablet 0  ? promethazine-dextromethorphan (PROMETHAZINE-DM) 6.25-15 MG/5ML syrup Take 5 mLs by mouth at bedtime as needed for cough. (Patient not taking: Reported on 07/31/2021) 100 mL 0  ? ?No facility-administered medications prior to visit.  ? ? ? ? ?Review of Systems ? ?Review of Systems  ?Constitutional: Negative.   ?HENT:  Positive for congestion and postnasal drip.   ?Respiratory:  Positive for cough, chest tightness and wheezing.   ?Cardiovascular: Negative.   ? ? ?Physical Exam ? ?BP 138/78 (BP Location: Right Arm, Patient Position: Sitting, Cuff Size: Normal)   Pulse 96   Temp 97.9 ?F (36.6 ?C) (Oral)   Ht '5\' 4"'$  (1.626 m)   Wt 179 lb (81.2 kg)   SpO2 96%   BMI 30.73 kg/m?  ?Physical Exam ?Constitutional:   ?   Appearance: Normal appearance.  ?HENT:  ?   Head: Normocephalic and atraumatic.  ?   Mouth/Throat:  ?   Mouth: Mucous membranes are moist.  ?   Pharynx: Oropharynx is clear.  ?Cardiovascular:  ?   Rate and Rhythm: Normal rate and regular rhythm.  ?Pulmonary:  ?   Breath sounds: Wheezing present. No rhonchi or rales.  ?Musculoskeletal:     ?   General: Normal range of motion.  ?Skin: ?   General: Skin is warm and dry.  ?Neurological:  ?   General: No focal deficit present.  ?   Mental Status: She is alert and oriented to person, place, and time. Mental status is at baseline.  ?Psychiatric:     ?   Mood and Affect: Mood normal.     ?   Behavior: Behavior normal.     ?   Thought Content: Thought content normal.     ?   Judgment: Judgment normal.  ?  ? ?Lab Results: ? ?CBC ?   ?Component Value Date/Time  ? WBC 12.1 (H) 01/07/2021 1230  ? RBC 4.66 01/07/2021 1230  ? HGB 14.5 01/07/2021 1230  ? HCT 44.3 01/07/2021 1230  ?  PLT 317 01/07/2021 1230  ? MCV 95.1 01/07/2021 1230  ? MCH 31.1 01/07/2021 1230  ? MCHC 32.7 01/07/2021 1230  ? RDW 12.1 01/07/2021 1230  ? LYMPHSABS 3.4 01/07/2021 1230  ? MONOABS 0.9 01/07/2021 1230  ? EOSABS 0.2 01/07/2021 1230  ? BASOSABS 0.1 01/07/2021 1230  ? ? ?BMET ?   ?Component Value Date/Time  ? NA 139 01/07/2021 1230  ? NA 139 04/09/2019 0000  ? K 3.4 (L) 01/07/2021 1230  ?  CL 105 01/07/2021 1230  ? CO2 26 01/07/2021 1230  ? GLUCOSE 99 01/07/2021 1230  ? BUN 27 (H) 01/07/2021 1230  ? BUN 22 (A) 04/09/2019 0000  ? CREATININE 0.68 01/07/2021 1230  ? CALCIUM 9.2 01/07/2021 1230  ? GFRNONAA >60 01/07/2021 1230  ? GFRAA >60 01/13/2020 1136  ? ? ?BNP ?   ?Component Value Date/Time  ? BNP 47.0 01/07/2021 1230  ? ? ?ProBNP ?No results found for: PROBNP ? ?Imaging: ?No results found. ? ? ?Assessment & Plan:  ? ?Acute asthmatic bronchitis ?- Patient develop cough with associated chest tightness and wheezing 2-3 weeks ago. She has wheezing on exam. Received depo-medrol '80mg'$  IM x1 in office. Checking CXR today. Sending in prednisone taper and refilling albuterol hfa to use every 6 hours for shortness of breath/wheezing along with promethazine-DM 82m every 6 hours for cough. Continue Singulair, Flonase and Protonix as directed. Start OTC antihistamine for seasonal allergies. FU in 2 weeks with Beth Np or sooner if needed. If not better consider starting return bronchodilator regimen.  ? ? ?EMartyn Ehrich NP ?07/31/2021 ? ?

## 2021-08-15 ENCOUNTER — Other Ambulatory Visit: Payer: Self-pay

## 2021-08-15 ENCOUNTER — Ambulatory Visit: Payer: Medicare PPO | Admitting: Primary Care

## 2021-08-15 DIAGNOSIS — J45909 Unspecified asthma, uncomplicated: Secondary | ICD-10-CM

## 2021-08-15 MED ORDER — PANTOPRAZOLE SODIUM 40 MG PO TBEC: 40.0000 mg | DELAYED_RELEASE_TABLET | Freq: Every day | ORAL | 1 refills | Status: DC

## 2021-08-15 NOTE — Patient Instructions (Signed)
Recommendations: ?Continue Singulair, Claritin, Flonase and Protonix ?Use albuterol 2 puffs every 6 hours as needed for shortness of breath/wheezing or chest tightness  ?Call if you develop worsening asthma symptoms (such as cough, chest tightness or wheezing) ? ?Follow-up: ?1 year with Dr. Melvyn Novas or sooner if needed ? ? ?Asthma, Adult ?Asthma is a long-term (chronic) condition in which the airways get tight and narrow. The airways are the breathing passages that lead from the nose and mouth down into the lungs. A person with asthma will have times when symptoms get worse. These are called asthma attacks. They can cause coughing, whistling sounds when you breathe (wheezing), shortness of breath, and chest pain. They can make it hard to breathe. There is no cure for asthma, but medicines and lifestyle changes can help control it. ?There are many things that can bring on an asthma attack or make asthma symptoms worse (triggers). Common triggers include: ?Mold. ?Dust. ?Cigarette smoke. ?Cockroaches. ?Things that can cause allergy symptoms (allergens). These include animal skin flakes (dander) and pollen from trees or grass. ?Things that pollute the air. These may include household cleaners, wood smoke, smog, or chemical odors. ?Cold air, weather changes, and wind. ?Crying or laughing hard. ?Stress. ?Certain medicines or drugs. ?Certain foods such as dried fruit, potato chips, and grape juice. ?Infections, such as a cold or the flu. ?Certain medical conditions or diseases. ?Exercise or tiring activities. ?Asthma may be treated with medicines and by staying away from the things that cause asthma attacks. Types of medicines may include: ?Controller medicines. These help prevent asthma symptoms. They are usually taken every day. ?Fast-acting reliever or rescue medicines. These quickly relieve asthma symptoms. They are used as needed and provide short-term relief. ?Allergy medicines if your attacks are brought on by  allergens. ?Medicines to help control the body's defense (immune) system. ?Follow these instructions at home: ?Avoiding triggers in your home ?Change your heating and air conditioning filter often. ?Limit your use of fireplaces and wood stoves. ?Get rid of pests (such as roaches and mice) and their droppings. ?Throw away plants if you see mold on them. ?Clean your floors. Dust regularly. Use cleaning products that do not smell. ?Have someone vacuum when you are not home. Use a vacuum cleaner with a HEPA filter if possible. ?Replace carpet with wood, tile, or vinyl flooring. Carpet can trap animal skin flakes and dust. ?Use allergy-proof pillows, mattress covers, and box spring covers. ?Wash bed sheets and blankets every week in hot water. Dry them in a dryer. ?Keep your bedroom free of any triggers. ?Avoid pets and keep windows closed when things that cause allergy symptoms are in the air. ?Use blankets that are made of polyester or cotton. ?Clean bathrooms and kitchens with bleach. If possible, have someone repaint the walls in these rooms with mold-resistant paint. Keep out of the rooms that are being cleaned and painted. ?Wash your hands often with soap and water. If soap and water are not available, use hand sanitizer. ?Do not allow anyone to smoke in your home. ?General instructions ?Take over-the-counter and prescription medicines only as told by your doctor. ?Talk with your doctor if you have questions about how or when to take your medicines. ?Make note if you need to use your medicines more often than usual. ?Do not use any products that contain nicotine or tobacco, such as cigarettes and e-cigarettes. If you need help quitting, ask your doctor. ?Stay away from secondhand smoke. ?Avoid doing things outdoors when allergen counts are high  and when air quality is low. ?Wear a ski mask when doing outdoor activities in the winter. The mask should cover your nose and mouth. Exercise indoors on cold days if you  can. ?Warm up before you exercise. Take time to cool down after exercise. ?Use a peak flow meter as told by your doctor. A peak flow meter is a tool that measures how well the lungs are working. ?Keep track of the peak flow meter's readings. Write them down. ?Follow your asthma action plan. This is a written plan for taking care of your asthma and treating your attacks. ?Make sure you get all the shots (vaccines) that your doctor recommends. Ask your doctor about a flu shot and a pneumonia shot. ?Keep all follow-up visits as told by your doctor. This is important. ?Contact a doctor if: ?You have wheezing, shortness of breath, or a cough even while taking medicine to prevent attacks. ?The mucus you cough up (sputum) is thicker than usual. ?The mucus you cough up changes from clear or white to yellow, green, gray, or bloody. ?You have problems from the medicine you are taking, such as: ?A rash. ?Itching. ?Swelling. ?Trouble breathing. ?You need reliever medicines more than 2-3 times a week. ?Your peak flow reading is still at 50-79% of your personal best after following the action plan for 1 hour. ?You have a fever. ?Get help right away if: ?You seem to be worse and are not responding to medicine during an asthma attack. ?You are short of breath even at rest. ?You get short of breath when doing very little activity. ?You have trouble eating, drinking, or talking. ?You have chest pain or tightness. ?You have a fast heartbeat. ?Your lips or fingernails start to turn blue. ?You are light-headed or dizzy, or you faint. ?Your peak flow is less than 50% of your personal best. ?You feel too tired to breathe normally. ?Summary ?Asthma is a long-term (chronic) condition in which the airways get tight and narrow. An asthma attack can make it hard to breathe. ?Asthma cannot be cured, but medicines and lifestyle changes can help control it. ?Make sure you understand how to avoid triggers and how and when to use your  medicines. ?This information is not intended to replace advice given to you by your health care provider. Make sure you discuss any questions you have with your health care provider. ?Document Revised: 09/05/2019 Document Reviewed: 09/15/2019 ?Elsevier Patient Education ? [place]. ? ?

## 2021-08-15 NOTE — Progress Notes (Signed)
? ?'@Patient'$  ID: Olivia Arnold, female    DOB: 21-Apr-1951, 71 y.o.   MRN: [id number] ? ?Chief Complaint  ?Patient presents with  ? Asthma  ? ? ?Referring provider: ?Celene Squibb, MD ? ?HPI: ?71 year old female, former smoker.  Past medical history significant for asthma, hypertension, hypothyroidism, ADD, anxiety/depression, vitamin D deficiency. Patient of Dr. Melvyn Novas, seen for initial consult on 03/29/2021 for asthmatic bronchitis. ? ?Previous [facility] pulmonary encounter:  ?07/31/2021 ?Patient presents today for acute office visit due to asthma flare. She complains of cough, wheezing and chest tightness. Symptoms started 2-3 weeks ago. She has not been on prednisone in several months. Her asthma worsened after virial respiratory infection in August 2022. Since she was last seen she was doing well. States that her cough got better. She is still taking Singulair, flonase, Protonix daily. She is not prescribed a maintenance inhaler. She has been using albuterol 2-3 times a day the last couple of weeks. Last breathing test was in 2016.  ? ?08/15/2021 ?Patient presents today for 2 week follow-up. She was treated for acute asthmatic bronchitis on 07/31/21 with depo-medrol '80mg'$  IM, prednisone taper and promethazine-DM. Prior to this her last exacerbation was in October 2022. She is feeling significant better today. She states that she was able to hike and use her kayak yesterday. She is consistently taking Claritin, Singulair, Flonase and protonix.   ? ? ?No Known Allergies ? ?Immunization History  ?Administered Date(s) Administered  ? Influenza,inj,Quad PF,6+ Mos 04/12/2019  ? Tdap 08/17/2012, 04/12/2019  ? ? ?Past Medical History:  ?Diagnosis Date  ? ADHD (attention deficit hyperactivity disorder)   ? Anxiety   ? Anxiety disorder, unspecified   ? Asthma   ? Atypical mole 11/09/2003  ? Right Scapula - minimal  ? Atypical mole 10/10/2008  ? Right Mid Paraspinal - slight to moderate   ? Atypical mole 12/06/2008  ? Left Chest - moderate  to severe [employer])  ? Atypical mole 08/23/2013  ? Right Post Shoulder, Sup - moderate  ? Atypical mole 08/23/2013  ? Right Post Shoulder, Inf - moderate  ? Atypical mole 08/23/2013  ? Left Upper Shoulder, Post - mild  ? Chest pain, unspecified   ? Depression   ? Essential (primary) hypertension   ? Essential hypertension   ? History of cardiac catheterization   ? [facility] July 2000 - normal coronaries  ? Hypothyroidism   ? Hypothyroidism, unspecified   ? Major depressive disorder, single episode, unspecified   ? Rosacea, unspecified   ? Unspecified asthma, uncomplicated   ? ? ?Tobacco History: ?Social History  ? ?Tobacco Use  ?Smoking Status Former  ? Packs/day: 1.00  ? Types: Cigarettes  ? Start date: 09/20/1968  ?Smokeless Tobacco Never  ? ?Counseling given: Not Answered ? ? ?Outpatient Medications Prior to Visit  ?Medication Sig Dispense Refill  ? albuterol (VENTOLIN HFA) 108 (90 Base) MCG/ACT inhaler Inhale 2 puffs into the lungs every 6 (six) hours as needed for wheezing or shortness of breath. 18 g 3  ? ARIPiprazole (ABILIFY) 2 MG tablet Take 2 mg by mouth at bedtime.    ? augmented betamethasone dipropionate (DIPROLENE-AF) 0.05 % ointment Apply topically daily.    ? cholecalciferol (VITAMIN D3) 25 MCG (1000 UT) tablet Take 1,000 Units by mouth daily.    ? clonazePAM (KLONOPIN) 0.5 MG tablet SMARTSIG:0.5-1 Tablet(s) By Mouth 1-2 Times Daily PRN    ? DULoxetine (CYMBALTA) 20 MG capsule Take 60 mg by mouth daily.    ? fluticasone (  FLONASE) 50 MCG/ACT nasal spray Place 2 sprays into both nostrils daily.    ? folic acid (FOLVITE) 1 MG tablet Take 400 mg by mouth daily.     ? hydrochlorothiazide (MICROZIDE) 12.5 MG capsule TAKE 1 CAPSULE(12.5 MG) BY MOUTH DAILY (Patient taking differently: Take 12.5 mg by mouth daily.) 30 capsule 1  ? hydrOXYzine (ATARAX) 25 MG tablet Take 25-50 mg by mouth daily as needed.    ? levothyroxine (SYNTHROID) 100 MCG tablet Take 1 tablet (100 mcg total) by mouth daily before breakfast. 90  tablet 3  ? losartan (COZAAR) 100 MG tablet Take 1 tablet (100 mg total) by mouth daily. 90 tablet 1  ? montelukast (SINGULAIR) 10 MG tablet Take 1 tablet (10 mg total) by mouth at bedtime. 90 tablet 3  ? Omega-3 Fatty Acids (FISH OIL PO) Take 2 capsules by mouth daily.    ? potassium chloride SA (KLOR-CON) 20 MEQ tablet TAKE 1 TABLET(20 MEQ) BY MOUTH DAILY (Patient taking differently: Take 20 mEq by mouth daily.) 30 tablet 3  ? PREMARIN vaginal cream Place 0.5 Applicatorfuls vaginally 2 (two) times a week. 42.5 g 11  ? promethazine-dextromethorphan (PROMETHAZINE-DM) 6.25-15 MG/5ML syrup Take 5 mLs by mouth 4 (four) times daily as needed for cough. (Patient not taking: Reported on 08/15/2021) 240 mL 0  ? benzonatate (TESSALON) 100 MG capsule Take 1-2 capsules (100-200 mg total) by mouth 3 (three) times daily as needed for cough. (Patient not taking: Reported on 08/15/2021) 60 capsule 0  ? pantoprazole (PROTONIX) 40 MG tablet Take 1 tablet (40 mg total) by mouth daily. Take 30-60 min before first meal of the day 30 tablet 2  ? predniSONE (DELTASONE) 10 MG tablet 4 tabs for 3 days, then 3 tabs for 3 days, 2 tabs for 3 days, then 1 tab for 3 days, then stop (Patient not taking: Reported on 08/15/2021) 30 tablet 0  ? ?No facility-administered medications prior to visit.  ? ? ?Review of Systems ? ?Review of Systems  ?Constitutional: Negative.   ?HENT: Negative.    ?Respiratory: Negative.    ? ? ?Physical Exam ? ?BP 130/82   Pulse 90   Wt 180 lb 9.6 oz (81.9 kg)   SpO2 97%   BMI 31.00 kg/m?  ?Physical Exam ?Constitutional:   ?   Appearance: Normal appearance.  ?HENT:  ?   Head: Normocephalic and atraumatic.  ?Cardiovascular:  ?   Rate and Rhythm: Normal rate and regular rhythm.  ?Pulmonary:  ?   Effort: Pulmonary effort is normal.  ?   Breath sounds: Normal breath sounds.  ?Neurological:  ?   General: No focal deficit present.  ?   Mental Status: She is alert and oriented to person, place, and time. Mental status is at  baseline.  ?Psychiatric:     ?   Mood and Affect: Mood normal.     ?   Behavior: Behavior normal.     ?   Thought Content: Thought content normal.     ?   Judgment: Judgment normal.  ?  ? ?Lab Results: ? ?CBC ?   ?Component Value Date/Time  ? WBC 12.1 (H) 01/07/2021 1230  ? RBC 4.66 01/07/2021 1230  ? HGB 14.5 01/07/2021 1230  ? HCT 44.3 01/07/2021 1230  ? PLT 317 01/07/2021 1230  ? MCV 95.1 01/07/2021 1230  ? MCH 31.1 01/07/2021 1230  ? MCHC 32.7 01/07/2021 1230  ? RDW 12.1 01/07/2021 1230  ? LYMPHSABS 3.4 01/07/2021 1230  ? MONOABS  0.9 01/07/2021 1230  ? EOSABS 0.2 01/07/2021 1230  ? BASOSABS 0.1 01/07/2021 1230  ? ? ?BMET ?   ?Component Value Date/Time  ? NA 139 01/07/2021 1230  ? NA 139 04/09/2019 0000  ? K 3.4 (L) 01/07/2021 1230  ? CL 105 01/07/2021 1230  ? CO2 26 01/07/2021 1230  ? GLUCOSE 99 01/07/2021 1230  ? BUN 27 (H) 01/07/2021 1230  ? BUN 22 (A) 04/09/2019 0000  ? CREATININE 0.68 01/07/2021 1230  ? CALCIUM 9.2 01/07/2021 1230  ? GFRNONAA >60 01/07/2021 1230  ? GFRAA >60 01/13/2020 1136  ? ? ?BNP ?   ?Component Value Date/Time  ? BNP 47.0 01/07/2021 1230  ? ? ?ProBNP ?No results found for: PROBNP ? ?Imaging: ?No results found. ? ? ?Assessment & Plan:  ? ?Acute asthmatic bronchitis ?- Resolved; Treated for acute exacerbation on 07/31/21 with depo-medrol, prednisone taper and promethazine- DM. Last exacerbation prior to this was in October 2022. Continue Claritin '10mg'$  daily, Singulair, flonase, protonix and prn Albuterol hfa. She is not on scheduled bronchodilators. Advised she call if develops increased frequency of asthma symptoms. Follow-up in 1 year with Dr. Melvyn Novas or sooner if needed.  ? ? ? ? ?Martyn Ehrich, NP ?09/18/2021 ? ?

## 2021-09-04 DIAGNOSIS — J4520 Mild intermittent asthma, uncomplicated: Secondary | ICD-10-CM | POA: Diagnosis not present

## 2021-09-04 DIAGNOSIS — F3341 Major depressive disorder, recurrent, in partial remission: Secondary | ICD-10-CM | POA: Diagnosis not present

## 2021-09-04 DIAGNOSIS — E039 Hypothyroidism, unspecified: Secondary | ICD-10-CM | POA: Diagnosis not present

## 2021-09-04 DIAGNOSIS — F410 Panic disorder [episodic paroxysmal anxiety] without agoraphobia: Secondary | ICD-10-CM | POA: Diagnosis not present

## 2021-09-04 DIAGNOSIS — F411 Generalized anxiety disorder: Secondary | ICD-10-CM | POA: Diagnosis not present

## 2021-09-04 DIAGNOSIS — I10 Essential (primary) hypertension: Secondary | ICD-10-CM | POA: Diagnosis not present

## 2021-09-04 DIAGNOSIS — F418 Other specified anxiety disorders: Secondary | ICD-10-CM | POA: Diagnosis not present

## 2021-09-04 DIAGNOSIS — E559 Vitamin D deficiency, unspecified: Secondary | ICD-10-CM | POA: Diagnosis not present

## 2021-09-08 DIAGNOSIS — R7303 Prediabetes: Secondary | ICD-10-CM | POA: Insufficient documentation

## 2021-09-18 NOTE — Assessment & Plan Note (Addendum)
-   Resolved; Treated for acute exacerbation on 07/31/21 with depo-medrol, prednisone taper and promethazine- DM. Last exacerbation prior to this was in October 2022. Continue Claritin '10mg'$  daily, Singulair, flonase, protonix and prn Albuterol hfa. She is not on scheduled bronchodilators. Advised she call if develops increased frequency of asthma symptoms. Follow-up in 1 year with Dr. Melvyn Novas or sooner if needed.  ? ?

## 2021-09-24 DIAGNOSIS — F332 Major depressive disorder, recurrent severe without psychotic features: Secondary | ICD-10-CM | POA: Diagnosis not present

## 2021-09-24 DIAGNOSIS — F410 Panic disorder [episodic paroxysmal anxiety] without agoraphobia: Secondary | ICD-10-CM | POA: Diagnosis not present

## 2021-10-04 ENCOUNTER — Encounter: Payer: Self-pay | Admitting: Physician Assistant

## 2021-10-04 ENCOUNTER — Ambulatory Visit: Payer: Medicare PPO | Admitting: Physician Assistant

## 2021-10-04 DIAGNOSIS — D225 Melanocytic nevi of trunk: Secondary | ICD-10-CM

## 2021-10-04 DIAGNOSIS — L219 Seborrheic dermatitis, unspecified: Secondary | ICD-10-CM | POA: Diagnosis not present

## 2021-10-04 DIAGNOSIS — L905 Scar conditions and fibrosis of skin: Secondary | ICD-10-CM | POA: Diagnosis not present

## 2021-10-04 DIAGNOSIS — L309 Dermatitis, unspecified: Secondary | ICD-10-CM

## 2021-10-04 DIAGNOSIS — D485 Neoplasm of uncertain behavior of skin: Secondary | ICD-10-CM

## 2021-10-04 MED ORDER — SULFACETAMIDE SODIUM (ACNE) 10 % EX LOTN: TOPICAL_LOTION | CUTANEOUS | 11 refills | Status: DC

## 2021-10-04 NOTE — Patient Instructions (Signed)
? ?  Eyes- Dr Marla Roe & [place] surgery for lipoma ? ? ?Biopsy, Surgery (Curettage) & Surgery (Excision) Aftercare Instructions ? ?1. Okay to remove bandage in 24 hours ? ?2. Wash area with soap and water ? ?3. Apply Vaseline to area twice daily until healed (Not Neosporin) ? ?4. Okay to cover with a Band-Aid to decrease the chance of infection or prevent irritation from clothing; also it's okay to uncover lesion at home. ? ?5. Suture instructions: return to our office in 7-10 or 10-14 days for a nurse visit for suture removal. Variable healing with sutures, if pain or itching occurs call our office. It's okay to shower or bathe 24 hours after sutures are given. ? ?6. The following risks may occur after a biopsy, curettage or excision: bleeding, scarring, discoloration, recurrence, infection (redness, yellow drainage, pain or swelling). ? ?7. For questions, concerns and results call our office at [facility] before 4pm & Friday before 3pm. Biopsy results will be available in 1 week. ? ?

## 2021-10-04 NOTE — Progress Notes (Signed)
? ?  Follow-Up Visit ?  ?Subjective  ?Olivia Arnold is a 71 y.o. female who presents for the following: Annual Exam (Here for annual skin exam. Concerns lesion on chest. Previous bx /Also lipoma on back. Also needs refill on betamethasone cream for dermatitis on hand. Ozzie Hoyle of atypical moles. Family history of skin cancers. ). ? ? ?The following portions of the chart were reviewed this encounter and updated as appropriate:  Tobacco  Allergies  Meds  Problems  Med Hx  Surg Hx  Fam Hx   ?  ? ?Objective  ?Well appearing patient in no apparent distress; mood and affect are within normal limits. ? ?A full examination was performed including scalp, head, eyes, ears, nose, lips, neck, chest, axillae, abdomen, back, buttocks, bilateral upper extremities, bilateral lower extremities, hands, feet, fingers, toes, fingernails, and toenails. All findings within normal limits unless otherwise noted below. ? ?Left Breast ?Bichromic dark nested macule.  ? ? ? ? ? ? ?face ?Thin scaly erythematous papules coalescing to plaques.  ? ?Left Mid Palm ?Xerosis, erosions and erythema ? ? ?Assessment & Plan  ?Neoplasm of uncertain behavior of skin ?Left Breast ? ?Skin / nail biopsy ?Type of biopsy: tangential   ?Informed consent: discussed and consent obtained   ?Timeout: patient name, date of birth, surgical site, and procedure verified   ?Procedure prep:  Patient was prepped and draped in usual sterile fashion (Non sterile) ?Prep type:  Chlorhexidine ?Anesthesia: the lesion was anesthetized in a standard fashion   ?Anesthetic:  1% lidocaine w/ epinephrine 1-100,000 local infiltration ?Instrument used: flexible razor blade   ?Outcome: patient tolerated procedure well   ?Post-procedure details: wound care instructions given   ? ?Specimen 1 - Surgical pathology ?Differential Diagnosis: r/o atypia ? ?Check Margins: yes ? ?Seborrheic dermatitis ?face ? ?Sulfacetamide Sodium, Acne, 10 % LOTN - face ?Apply to affected area  qd ? ?Dermatitis ?Left Mid Palm ? ?Patient has betamethasone cream.  ? ? ? ?I, Rudy Luhmann, PA-C, have reviewed all documentation's for this visit.  The documentation on 10/04/21 for the exam, diagnosis, procedures and orders are all accurate and complete. ?

## 2021-10-09 ENCOUNTER — Telehealth: Payer: Self-pay | Admitting: *Deleted

## 2021-10-09 DIAGNOSIS — I10 Essential (primary) hypertension: Secondary | ICD-10-CM | POA: Diagnosis not present

## 2021-10-09 DIAGNOSIS — F418 Other specified anxiety disorders: Secondary | ICD-10-CM | POA: Diagnosis not present

## 2021-10-09 DIAGNOSIS — E782 Mixed hyperlipidemia: Secondary | ICD-10-CM | POA: Diagnosis not present

## 2021-10-09 DIAGNOSIS — D1730 Benign lipomatous neoplasm of skin and subcutaneous tissue of unspecified sites: Secondary | ICD-10-CM | POA: Diagnosis not present

## 2021-10-09 DIAGNOSIS — R7303 Prediabetes: Secondary | ICD-10-CM | POA: Diagnosis not present

## 2021-10-09 NOTE — Telephone Encounter (Signed)
-----   Message from Warren Danes, [place] sent at 10/09/2021  8:05 AM EDT ----- ?Recheck 6-9 months ?

## 2021-10-09 NOTE — Telephone Encounter (Signed)
Left message for patient to return our phone call for pathology results.  ?

## 2021-11-05 DIAGNOSIS — F411 Generalized anxiety disorder: Secondary | ICD-10-CM | POA: Diagnosis not present

## 2021-11-05 DIAGNOSIS — F410 Panic disorder [episodic paroxysmal anxiety] without agoraphobia: Secondary | ICD-10-CM | POA: Diagnosis not present

## 2021-11-05 DIAGNOSIS — F3341 Major depressive disorder, recurrent, in partial remission: Secondary | ICD-10-CM | POA: Diagnosis not present

## 2021-11-07 DIAGNOSIS — F3341 Major depressive disorder, recurrent, in partial remission: Secondary | ICD-10-CM | POA: Diagnosis not present

## 2021-11-07 DIAGNOSIS — F410 Panic disorder [episodic paroxysmal anxiety] without agoraphobia: Secondary | ICD-10-CM | POA: Diagnosis not present

## 2021-11-20 DIAGNOSIS — R7303 Prediabetes: Secondary | ICD-10-CM | POA: Diagnosis not present

## 2021-12-02 IMAGING — DX DG CHEST 2V
2 series · 2 of 2 positions shown · non-contrast
Comparison: January 07, 2021.

CLINICAL DATA: Shortness of breath x4 days worse with exertion.

EXAM:
CHEST - 2 VIEW

[chest pa]
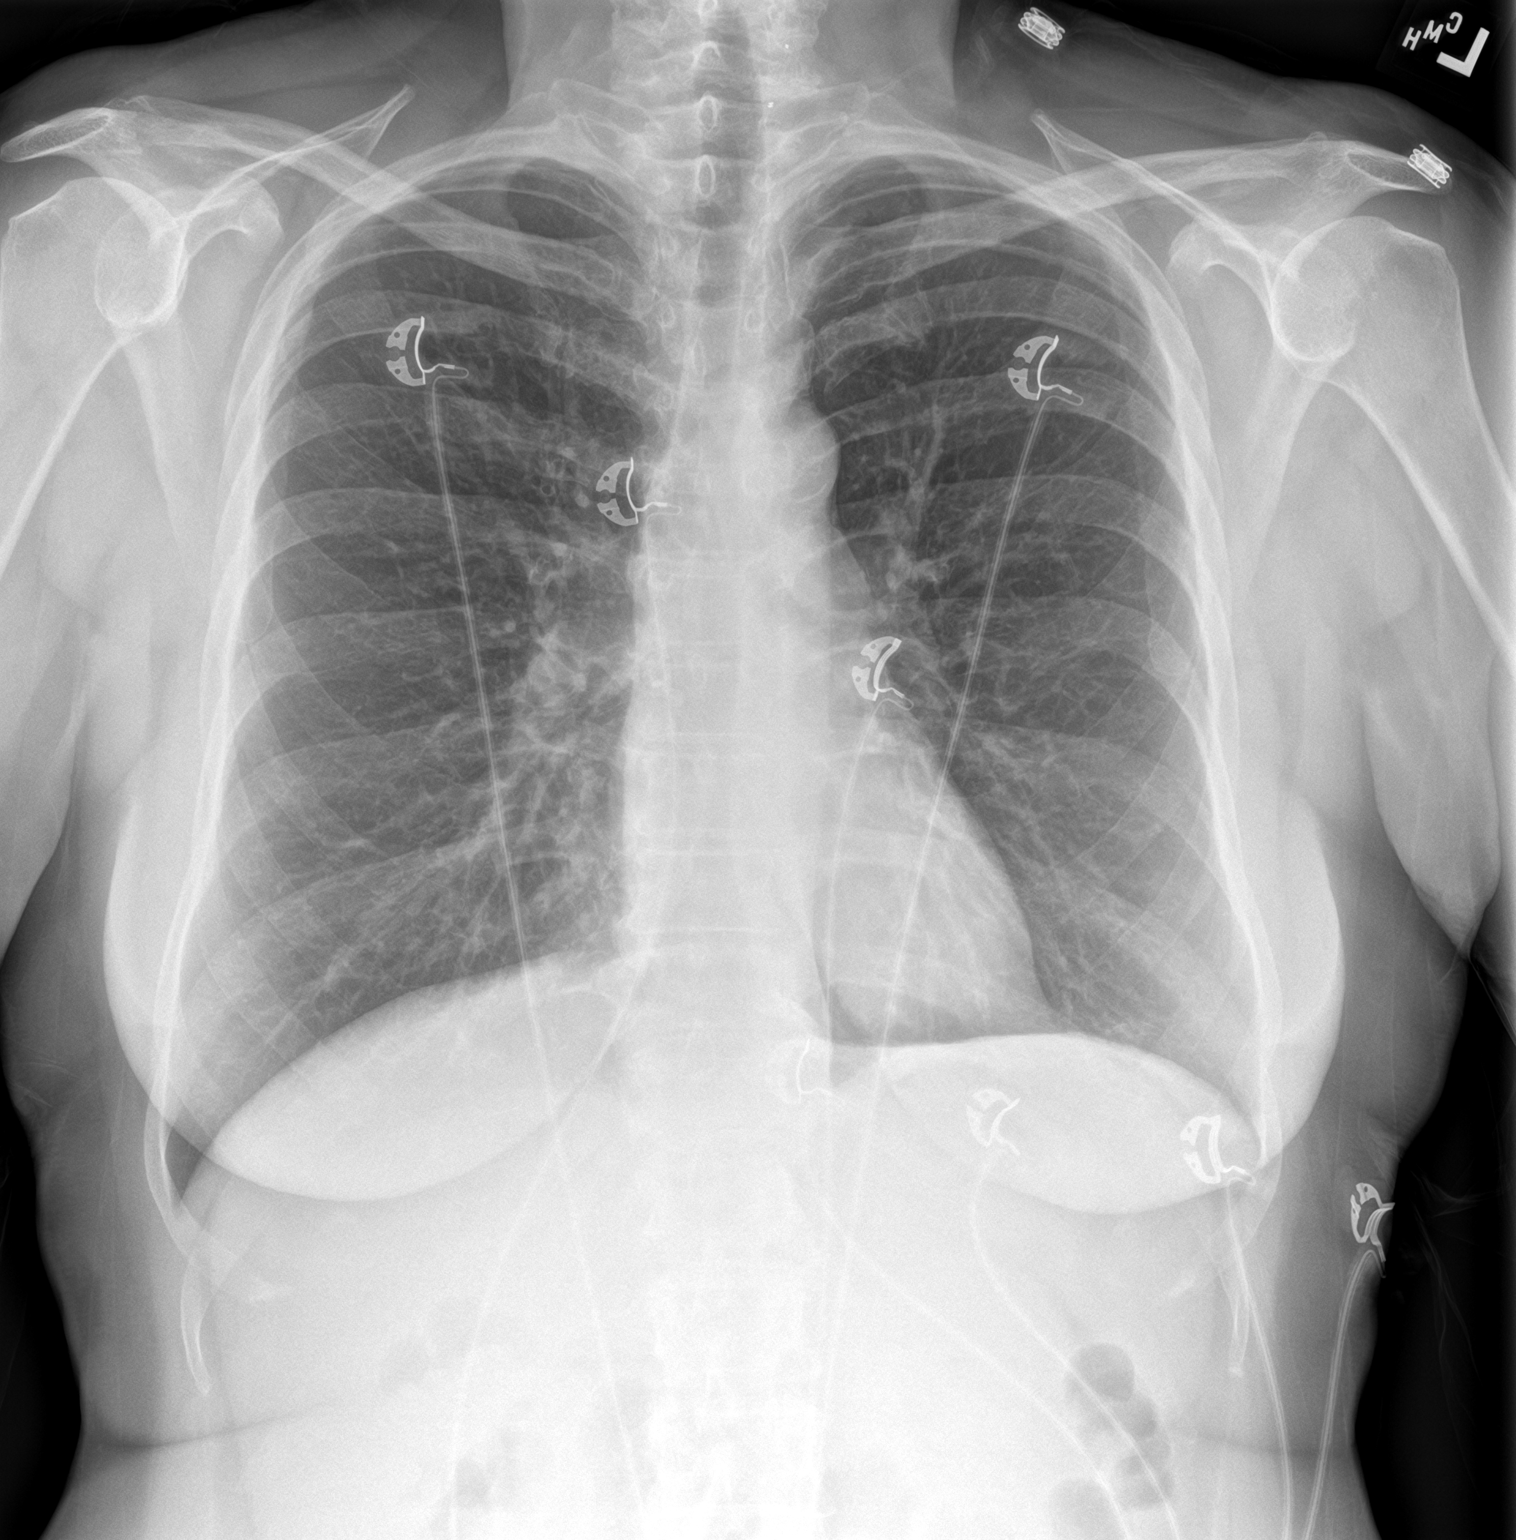

[chest lat]
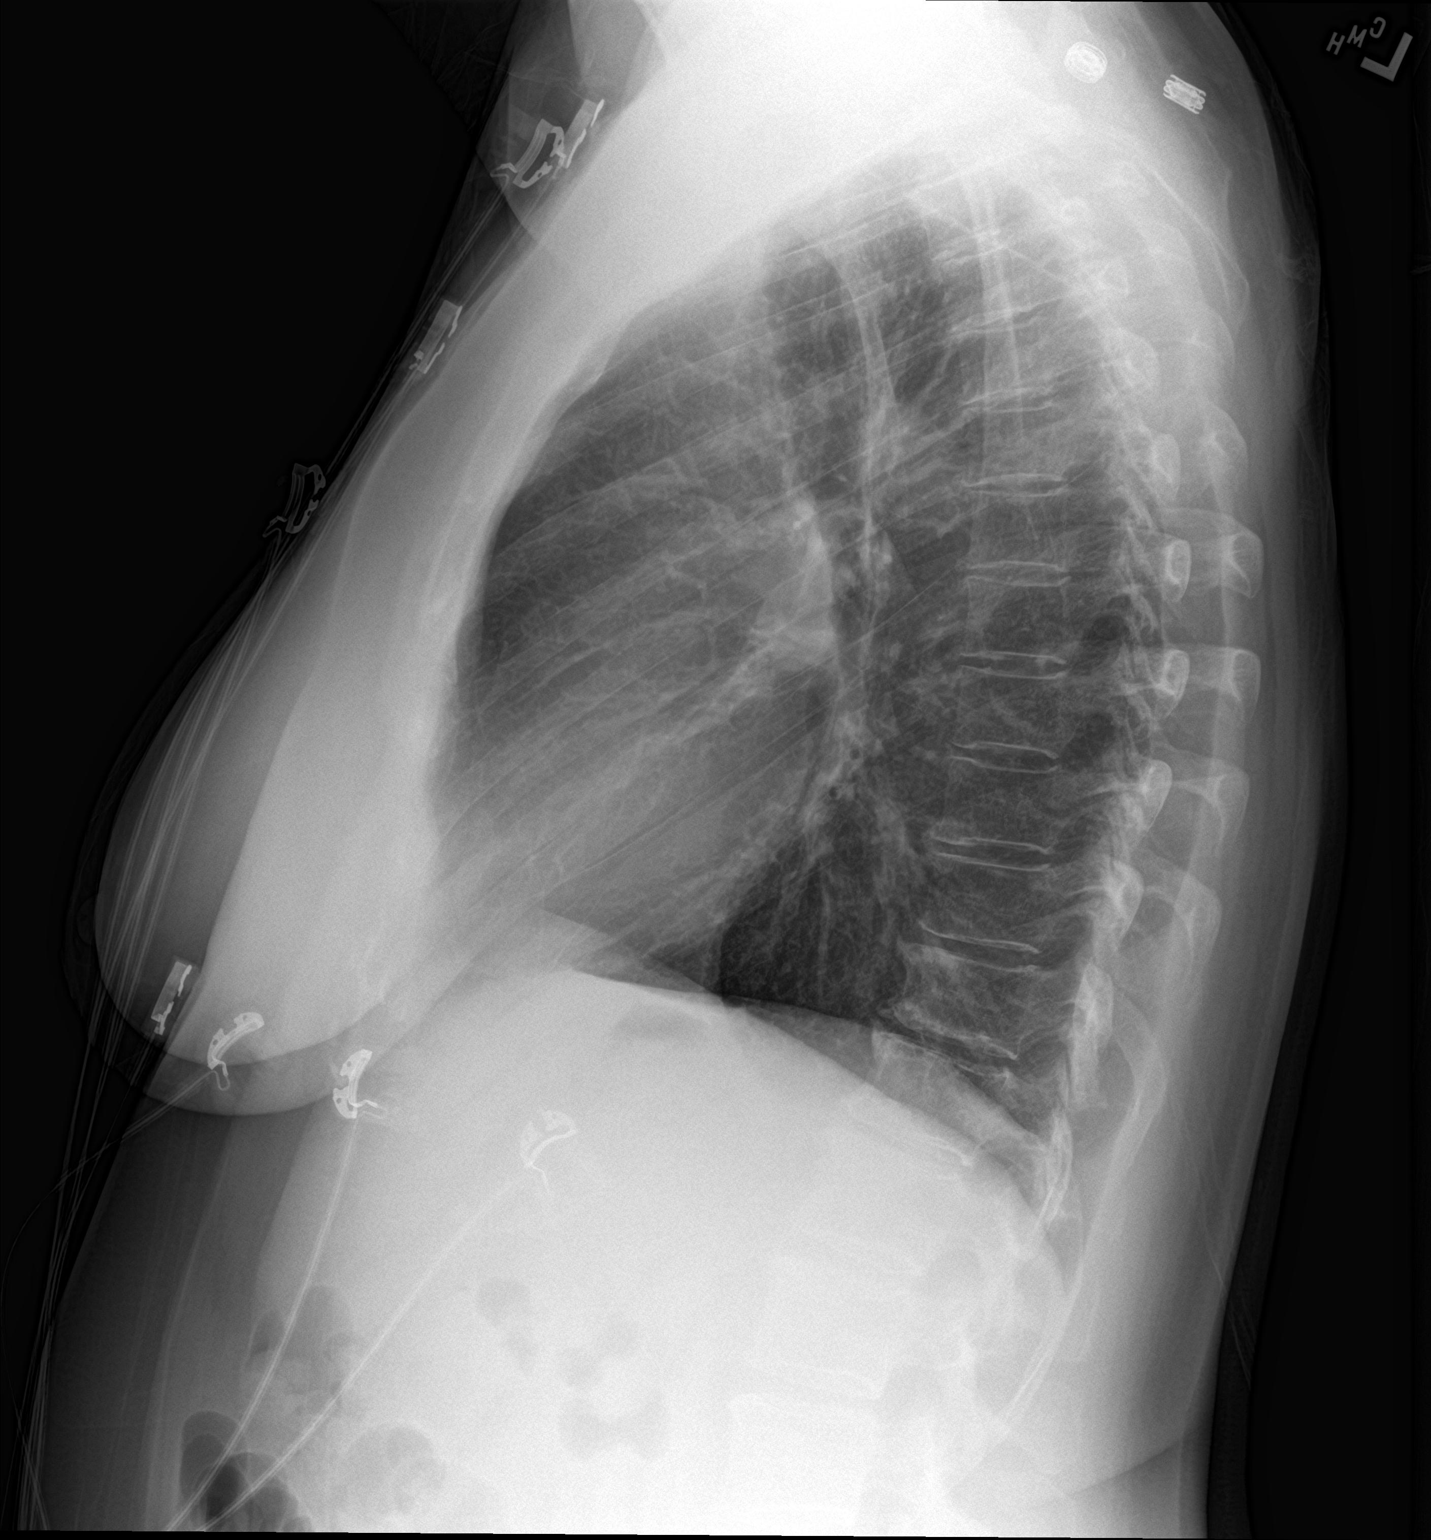

[2 of 2 positions shown; findings below may reference images not displayed]

FINDINGS: The heart size and mediastinal contours are within normal limits.
Atherosclerotic calcifications of the aortic arch. Mild diffuse
bronchial wall thickening. No focal airspace consolidation. No
pleural effusion. No pneumothorax. The visualized skeletal
structures are unremarkable.
IMPRESSION: Mild diffuse bronchial wall thickening, which may reflect chronic
bronchitis. No focal airspace consolidation.

## 2021-12-04 DIAGNOSIS — I10 Essential (primary) hypertension: Secondary | ICD-10-CM | POA: Diagnosis not present

## 2021-12-04 DIAGNOSIS — E039 Hypothyroidism, unspecified: Secondary | ICD-10-CM | POA: Diagnosis not present

## 2021-12-11 DIAGNOSIS — H9193 Unspecified hearing loss, bilateral: Secondary | ICD-10-CM | POA: Insufficient documentation

## 2021-12-11 DIAGNOSIS — D1730 Benign lipomatous neoplasm of skin and subcutaneous tissue of unspecified sites: Secondary | ICD-10-CM | POA: Diagnosis not present

## 2021-12-11 DIAGNOSIS — Z974 Presence of external hearing-aid: Secondary | ICD-10-CM | POA: Diagnosis not present

## 2021-12-11 DIAGNOSIS — I10 Essential (primary) hypertension: Secondary | ICD-10-CM | POA: Diagnosis not present

## 2021-12-11 DIAGNOSIS — R7303 Prediabetes: Secondary | ICD-10-CM | POA: Diagnosis not present

## 2021-12-11 DIAGNOSIS — F418 Other specified anxiety disorders: Secondary | ICD-10-CM | POA: Diagnosis not present

## 2021-12-11 DIAGNOSIS — E782 Mixed hyperlipidemia: Secondary | ICD-10-CM | POA: Diagnosis not present

## 2021-12-11 DIAGNOSIS — E039 Hypothyroidism, unspecified: Secondary | ICD-10-CM | POA: Diagnosis not present

## 2021-12-11 DIAGNOSIS — Z23 Encounter for immunization: Secondary | ICD-10-CM | POA: Diagnosis not present

## 2021-12-20 DIAGNOSIS — F411 Generalized anxiety disorder: Secondary | ICD-10-CM | POA: Diagnosis not present

## 2021-12-20 DIAGNOSIS — F3341 Major depressive disorder, recurrent, in partial remission: Secondary | ICD-10-CM | POA: Diagnosis not present

## 2021-12-31 ENCOUNTER — Other Ambulatory Visit: Payer: Self-pay | Admitting: Physician Assistant

## 2022-01-16 DIAGNOSIS — R7303 Prediabetes: Secondary | ICD-10-CM | POA: Diagnosis not present

## 2022-01-16 DIAGNOSIS — Z6825 Body mass index (BMI) 25.0-25.9, adult: Secondary | ICD-10-CM | POA: Diagnosis not present

## 2022-02-19 DIAGNOSIS — F410 Panic disorder [episodic paroxysmal anxiety] without agoraphobia: Secondary | ICD-10-CM | POA: Diagnosis not present

## 2022-02-19 DIAGNOSIS — F411 Generalized anxiety disorder: Secondary | ICD-10-CM | POA: Diagnosis not present

## 2022-02-19 DIAGNOSIS — F3341 Major depressive disorder, recurrent, in partial remission: Secondary | ICD-10-CM | POA: Diagnosis not present

## 2022-02-20 DIAGNOSIS — N390 Urinary tract infection, site not specified: Secondary | ICD-10-CM | POA: Diagnosis not present

## 2022-03-28 DIAGNOSIS — F411 Generalized anxiety disorder: Secondary | ICD-10-CM | POA: Diagnosis not present

## 2022-03-28 DIAGNOSIS — F3341 Major depressive disorder, recurrent, in partial remission: Secondary | ICD-10-CM | POA: Diagnosis not present

## 2022-04-02 DIAGNOSIS — E782 Mixed hyperlipidemia: Secondary | ICD-10-CM | POA: Diagnosis not present

## 2022-04-02 DIAGNOSIS — R7303 Prediabetes: Secondary | ICD-10-CM | POA: Diagnosis not present

## 2022-04-09 DIAGNOSIS — R112 Nausea with vomiting, unspecified: Secondary | ICD-10-CM | POA: Diagnosis not present

## 2022-04-09 DIAGNOSIS — E039 Hypothyroidism, unspecified: Secondary | ICD-10-CM | POA: Diagnosis not present

## 2022-04-09 DIAGNOSIS — R7303 Prediabetes: Secondary | ICD-10-CM | POA: Diagnosis not present

## 2022-04-09 DIAGNOSIS — F418 Other specified anxiety disorders: Secondary | ICD-10-CM | POA: Diagnosis not present

## 2022-04-09 DIAGNOSIS — D1730 Benign lipomatous neoplasm of skin and subcutaneous tissue of unspecified sites: Secondary | ICD-10-CM | POA: Diagnosis not present

## 2022-04-09 DIAGNOSIS — I700 Atherosclerosis of aorta: Secondary | ICD-10-CM | POA: Insufficient documentation

## 2022-04-09 DIAGNOSIS — Z974 Presence of external hearing-aid: Secondary | ICD-10-CM | POA: Diagnosis not present

## 2022-04-09 DIAGNOSIS — E782 Mixed hyperlipidemia: Secondary | ICD-10-CM | POA: Diagnosis not present

## 2022-04-09 DIAGNOSIS — I10 Essential (primary) hypertension: Secondary | ICD-10-CM | POA: Diagnosis not present

## 2022-04-09 DIAGNOSIS — H9193 Unspecified hearing loss, bilateral: Secondary | ICD-10-CM | POA: Diagnosis not present

## 2022-05-07 DIAGNOSIS — J4522 Mild intermittent asthma with status asthmaticus: Secondary | ICD-10-CM | POA: Diagnosis not present

## 2022-05-07 DIAGNOSIS — R112 Nausea with vomiting, unspecified: Secondary | ICD-10-CM | POA: Diagnosis not present

## 2022-05-07 DIAGNOSIS — R7303 Prediabetes: Secondary | ICD-10-CM | POA: Diagnosis not present

## 2022-05-13 DIAGNOSIS — F410 Panic disorder [episodic paroxysmal anxiety] without agoraphobia: Secondary | ICD-10-CM | POA: Diagnosis not present

## 2022-05-13 DIAGNOSIS — F411 Generalized anxiety disorder: Secondary | ICD-10-CM | POA: Diagnosis not present

## 2022-05-13 DIAGNOSIS — F3341 Major depressive disorder, recurrent, in partial remission: Secondary | ICD-10-CM | POA: Diagnosis not present

## 2022-07-09 DIAGNOSIS — N952 Postmenopausal atrophic vaginitis: Secondary | ICD-10-CM | POA: Diagnosis not present

## 2022-07-09 DIAGNOSIS — Z1231 Encounter for screening mammogram for malignant neoplasm of breast: Secondary | ICD-10-CM | POA: Diagnosis not present

## 2022-07-09 DIAGNOSIS — Z124 Encounter for screening for malignant neoplasm of cervix: Secondary | ICD-10-CM | POA: Diagnosis not present

## 2022-07-09 DIAGNOSIS — Z01419 Encounter for gynecological examination (general) (routine) without abnormal findings: Secondary | ICD-10-CM | POA: Diagnosis not present

## 2022-07-09 DIAGNOSIS — Z6826 Body mass index (BMI) 26.0-26.9, adult: Secondary | ICD-10-CM | POA: Diagnosis not present

## 2022-07-09 DIAGNOSIS — Z01411 Encounter for gynecological examination (general) (routine) with abnormal findings: Secondary | ICD-10-CM | POA: Diagnosis not present

## 2022-08-12 DIAGNOSIS — F411 Generalized anxiety disorder: Secondary | ICD-10-CM | POA: Diagnosis not present

## 2022-08-12 DIAGNOSIS — F3341 Major depressive disorder, recurrent, in partial remission: Secondary | ICD-10-CM | POA: Diagnosis not present

## 2022-08-12 DIAGNOSIS — F410 Panic disorder [episodic paroxysmal anxiety] without agoraphobia: Secondary | ICD-10-CM | POA: Diagnosis not present

## 2022-08-18 ENCOUNTER — Emergency Department (HOSPITAL_COMMUNITY): Payer: Medicare PPO

## 2022-08-18 ENCOUNTER — Observation Stay (HOSPITAL_COMMUNITY)
Admission: EM | Admit: 2022-08-18 | Discharge: 2022-08-19 | Disposition: A | Discharge status: Home / Self Care | Payer: Medicare PPO | Attending: Family Medicine | Admitting: Family Medicine

## 2022-08-18 ENCOUNTER — Encounter (HOSPITAL_COMMUNITY): Payer: Self-pay

## 2022-08-18 ENCOUNTER — Other Ambulatory Visit: Payer: Self-pay

## 2022-08-18 DIAGNOSIS — E782 Mixed hyperlipidemia: Secondary | ICD-10-CM | POA: Insufficient documentation

## 2022-08-18 DIAGNOSIS — Z79899 Other long term (current) drug therapy: Secondary | ICD-10-CM | POA: Insufficient documentation

## 2022-08-18 DIAGNOSIS — I10 Essential (primary) hypertension: Secondary | ICD-10-CM | POA: Diagnosis present

## 2022-08-18 DIAGNOSIS — Z87891 Personal history of nicotine dependence: Secondary | ICD-10-CM | POA: Insufficient documentation

## 2022-08-18 DIAGNOSIS — E039 Hypothyroidism, unspecified: Secondary | ICD-10-CM | POA: Diagnosis present

## 2022-08-18 DIAGNOSIS — J45901 Unspecified asthma with (acute) exacerbation: Secondary | ICD-10-CM | POA: Diagnosis not present

## 2022-08-18 DIAGNOSIS — T7840XA Allergy, unspecified, initial encounter: Secondary | ICD-10-CM | POA: Diagnosis present

## 2022-08-18 DIAGNOSIS — R0602 Shortness of breath: Secondary | ICD-10-CM | POA: Diagnosis not present

## 2022-08-18 DIAGNOSIS — T7840XS Allergy, unspecified, sequela: Secondary | ICD-10-CM

## 2022-08-18 DIAGNOSIS — Z1152 Encounter for screening for COVID-19: Secondary | ICD-10-CM | POA: Insufficient documentation

## 2022-08-18 DIAGNOSIS — J4541 Moderate persistent asthma with (acute) exacerbation: Secondary | ICD-10-CM | POA: Diagnosis not present

## 2022-08-18 LAB — BASIC METABOLIC PANEL
Anion gap: 9 (ref 5–15)
BUN: 18 mg/dL (ref 8–23)
CO2: 26 mmol/L (ref 22–32)
Calcium: 9.5 mg/dL (ref 8.9–10.3)
Chloride: 103 mmol/L (ref 98–111)
Creatinine, Ser: 0.67 mg/dL (ref 0.44–1.00)
GFR, Estimated: >60 mL/min (ref >60)
Glucose, Bld: 124 mg/dL — ABNORMAL HIGH (ref 70–99)
Potassium: 3.7 mmol/L (ref 3.5–5.1)
Sodium: 138 mmol/L (ref 135–145)

## 2022-08-18 LAB — CBC WITH DIFFERENTIAL/PLATELET
Abs Immature Granulocytes: 0.03 10*3/uL (ref 0.00–0.07)
Basophils Absolute: 0.1 10*3/uL (ref 0.0–0.1)
Basophils Relative: 1 %
Eosinophils Absolute: 1.3 10*3/uL — ABNORMAL HIGH (ref 0.0–0.5)
Eosinophils Relative: 12 %
HCT: 46.2 % — ABNORMAL HIGH (ref 36.0–46.0)
Hemoglobin: 15.2 g/dL — ABNORMAL HIGH (ref 12.0–15.0)
Immature Granulocytes: 0 %
Lymphocytes Relative: 22 %
Lymphs Abs: 2.4 10*3/uL (ref 0.7–4.0)
MCH: 31 pg (ref 26.0–34.0)
MCHC: 32.9 g/dL (ref 30.0–36.0)
MCV: 94.3 fL (ref 80.0–100.0)
Monocytes Absolute: 0.6 10*3/uL (ref 0.1–1.0)
Monocytes Relative: 5 %
Neutro Abs: 6.6 10*3/uL (ref 1.7–7.7)
Neutrophils Relative %: 60 %
Platelets: 356 10*3/uL (ref 150–400)
RBC: 4.9 MIL/uL (ref 3.87–5.11)
RDW: 12.6 % (ref 11.5–15.5)
WBC: 11 10*3/uL — ABNORMAL HIGH (ref 4.0–10.5)
nRBC: 0 % (ref 0.0–0.2)

## 2022-08-18 LAB — TROPONIN I (HIGH SENSITIVITY)
Troponin I (High Sensitivity): 4 ng/L (ref <18)
Troponin I (High Sensitivity): 3 ng/L (ref <18)

## 2022-08-18 LAB — RESP PANEL BY RT-PCR (RSV, FLU A&B, COVID)  RVPGX2
Influenza A by PCR: NEGATIVE
Influenza B by PCR: NEGATIVE
Resp Syncytial Virus by PCR: NEGATIVE
SARS Coronavirus 2 by RT PCR: NEGATIVE

## 2022-08-18 MED ORDER — ONDANSETRON HCL 4 MG PO TABS: 4.0000 mg | ORAL_TABLET | Freq: Four times a day (QID) | ORAL | Status: DC | PRN

## 2022-08-18 MED ORDER — ACETAMINOPHEN 325 MG PO TABS: 650.0000 mg | ORAL_TABLET | Freq: Four times a day (QID) | ORAL | Status: DC | PRN

## 2022-08-18 MED ORDER — ALBUTEROL SULFATE (2.5 MG/3ML) 0.083% IN NEBU
INHALATION_SOLUTION | RESPIRATORY_TRACT | Status: AC
  Administered 2022-08-18: 10 mg
  Filled 2022-08-18: qty 12

## 2022-08-18 MED ORDER — ONDANSETRON HCL 4 MG/2ML IJ SOLN: 4.0000 mg | Freq: Four times a day (QID) | INTRAMUSCULAR | Status: DC | PRN

## 2022-08-18 MED ORDER — MONTELUKAST SODIUM 10 MG PO TABS
10.0000 mg | ORAL_TABLET | Freq: Every day | ORAL | Status: DC
  Administered 2022-08-18: 10 mg via ORAL
  Filled 2022-08-18: qty 1

## 2022-08-18 MED ORDER — LORATADINE 10 MG PO TABS
10.0000 mg | ORAL_TABLET | Freq: Every day | ORAL | Status: DC
  Administered 2022-08-19: 10 mg via ORAL
  Filled 2022-08-18 (×2): qty 1

## 2022-08-18 MED ORDER — MAGNESIUM SULFATE 2 GM/50ML IV SOLN
2.0000 g | Freq: Once | INTRAVENOUS | Status: AC
  Administered 2022-08-18: 2 g via INTRAVENOUS
  Filled 2022-08-18: qty 50

## 2022-08-18 MED ORDER — ALBUTEROL SULFATE (2.5 MG/3ML) 0.083% IN NEBU: 10.0000 mg/h | INHALATION_SOLUTION | RESPIRATORY_TRACT | Status: DC

## 2022-08-18 MED ORDER — IPRATROPIUM-ALBUTEROL 0.5-2.5 (3) MG/3ML IN SOLN
3.0000 mL | Freq: Four times a day (QID) | RESPIRATORY_TRACT | Status: DC
  Administered 2022-08-18 – 2022-08-19 (×3): 3 mL via RESPIRATORY_TRACT
  Filled 2022-08-18 (×3): qty 3

## 2022-08-18 MED ORDER — HEPARIN SODIUM (PORCINE) 5000 UNIT/ML IJ SOLN
5000.0000 [IU] | Freq: Three times a day (TID) | INTRAMUSCULAR | Status: DC
  Administered 2022-08-18 – 2022-08-19 (×2): 5000 [IU] via SUBCUTANEOUS
  Filled 2022-08-18 (×2): qty 1

## 2022-08-18 MED ORDER — ALBUTEROL (5 MG/ML) CONTINUOUS INHALATION SOLN
10.0000 mg/h | INHALATION_SOLUTION | RESPIRATORY_TRACT | Status: DC
  Administered 2022-08-18: 10 mg/h via RESPIRATORY_TRACT

## 2022-08-18 MED ORDER — METHYLPREDNISOLONE SODIUM SUCC 125 MG IJ SOLR
125.0000 mg | Freq: Once | INTRAMUSCULAR | Status: AC
  Administered 2022-08-18: 125 mg via INTRAVENOUS
  Filled 2022-08-18: qty 2

## 2022-08-18 MED ORDER — ATORVASTATIN CALCIUM 40 MG PO TABS
20.0000 mg | ORAL_TABLET | Freq: Every day | ORAL | Status: DC
  Administered 2022-08-18: 20 mg via ORAL
  Filled 2022-08-18: qty 1

## 2022-08-18 MED ORDER — DIPHENHYDRAMINE HCL 50 MG/ML IJ SOLN: 50.0000 mg | Freq: Every evening | INTRAMUSCULAR | Status: DC | PRN

## 2022-08-18 MED ORDER — ALBUTEROL SULFATE (2.5 MG/3ML) 0.083% IN NEBU
2.5000 mg | INHALATION_SOLUTION | RESPIRATORY_TRACT | Status: DC | PRN
  Administered 2022-08-18: 2.5 mg via RESPIRATORY_TRACT
  Filled 2022-08-18: qty 3

## 2022-08-18 MED ORDER — HYDROCHLOROTHIAZIDE 12.5 MG PO CAPS
12.5000 mg | ORAL_CAPSULE | Freq: Every day | ORAL | Status: DC
  Administered 2022-08-19: 12.5 mg via ORAL
  Filled 2022-08-18 (×3): qty 1

## 2022-08-18 MED ORDER — FLUTICASONE PROPIONATE 50 MCG/ACT NA SUSP
2.0000 | Freq: Every day | NASAL | Status: DC
  Administered 2022-08-19: 2 via NASAL
  Filled 2022-08-18 (×2): qty 16

## 2022-08-18 MED ORDER — LEVOTHYROXINE SODIUM 88 MCG PO TABS
88.0000 ug | ORAL_TABLET | Freq: Every day | ORAL | Status: DC
  Administered 2022-08-19: 88 ug via ORAL
  Filled 2022-08-18: qty 1

## 2022-08-18 MED ORDER — OXYCODONE HCL 5 MG PO TABS: 5.0000 mg | ORAL_TABLET | ORAL | Status: DC | PRN

## 2022-08-18 MED ORDER — FAMOTIDINE IN NACL 20-0.9 MG/50ML-% IV SOLN
20.0000 mg | INTRAVENOUS | Status: DC
  Administered 2022-08-18: 20 mg via INTRAVENOUS
  Filled 2022-08-18: qty 50

## 2022-08-18 MED ORDER — IPRATROPIUM-ALBUTEROL 0.5-2.5 (3) MG/3ML IN SOLN
RESPIRATORY_TRACT | Status: AC
  Filled 2022-08-18: qty 6

## 2022-08-18 MED ORDER — DULOXETINE HCL 60 MG PO CPEP
60.0000 mg | ORAL_CAPSULE | Freq: Every day | ORAL | Status: DC
  Administered 2022-08-19: 60 mg via ORAL
  Filled 2022-08-18 (×2): qty 1

## 2022-08-18 MED ORDER — METHYLPREDNISOLONE SODIUM SUCC 125 MG IJ SOLR
125.0000 mg | Freq: Two times a day (BID) | INTRAMUSCULAR | Status: DC
  Administered 2022-08-19: 125 mg via INTRAVENOUS
  Filled 2022-08-18: qty 2

## 2022-08-18 MED ORDER — ALBUTEROL (5 MG/ML) CONTINUOUS INHALATION SOLN
5.0000 mg/h | INHALATION_SOLUTION | Freq: Once | RESPIRATORY_TRACT | Status: DC
  Filled 2022-08-18: qty 20

## 2022-08-18 MED ORDER — ACETAMINOPHEN 650 MG RE SUPP: 650.0000 mg | Freq: Four times a day (QID) | RECTAL | Status: DC | PRN

## 2022-08-18 MED ORDER — IPRATROPIUM-ALBUTEROL 0.5-2.5 (3) MG/3ML IN SOLN
9.0000 mL | Freq: Once | RESPIRATORY_TRACT | Status: AC
  Administered 2022-08-18: 9 mL via RESPIRATORY_TRACT
  Filled 2022-08-18: qty 3

## 2022-08-18 NOTE — Assessment & Plan Note (Signed)
Continue Synthroid °

## 2022-08-18 NOTE — Assessment & Plan Note (Signed)
Continue hydrochlorothiazide. 

## 2022-08-18 NOTE — Assessment & Plan Note (Signed)
-   Likely the trigger of the asthma exacerbation - Continue steroid, Pepcid, Benadryl as needed - Continue home Singulair - Continue to monitor

## 2022-08-18 NOTE — ED Provider Notes (Signed)
[facility] Provider Note   CSN: [id number] Arrival date & time: 08/18/22  1357     History  Chief Complaint  Patient presents with   Shortness of Olivia Arnold is a 72 y.o. female.  With history of asthma, anxiety, depression, ADHD, hypertension, hypothyroidism who presents to the ED for evaluation of shortness of breath and wheezing.  She states she was cutting bushes in her yard yesterday which caused a flareup of her asthma.  She started wheezing shortly after that.  She developed shortness of breath at rest last night prior to going to bed.  She reports using her albuterol inhaler approximately 12 times between yesterday and arrival to the ED.  She typically coughs after she uses her inhaler.  This cough is nonproductive.  She reports anterior chest wall pain after episodes of coughing as well.  She denies dizziness, lightheadedness, abdominal pain, nausea, vomiting.  She has never needed to be intubated or admitted for asthma exacerbation.  She has not smoked in 25 years.  She states she typically gets a similar flareup once per year around this time.  She denies unilateral leg swelling, hemoptysis, history of DVT or PE, recent travel, surgery or cancer treatments.  Sees pulmonology and has never been diagnosed with chronic bronchitis or emphysema.   Shortness of Breath Associated symptoms: chest pain and cough        Home Medications Prior to Admission medications   Medication Sig Start Date End Date Taking? Authorizing Provider  albuterol (VENTOLIN HFA) 108 (90 Base) MCG/ACT inhaler Inhale 2 puffs into the lungs every 6 (six) hours as needed for wheezing or shortness of breath. 07/31/21  Yes Martyn Ehrich, NP  atorvastatin (LIPITOR) 20 MG tablet Take 20 mg by mouth at bedtime. 09/08/21  Yes [provider]  augmented betamethasone dipropionate (DIPROLENE-AF) 0.05 % ointment APPLY TOPICALLY DAILY AS  DIRECTED. Patient taking differently: 1 Application daily. 12/31/21  Yes Lavonna Monarch, MD  cholecalciferol (VITAMIN D3) 25 MCG (1000 UT) tablet Take 1,000 Units by mouth daily.   Yes [provider]  DULoxetine (CYMBALTA) 60 MG capsule Take 60 mg by mouth daily. 08/31/21  Yes [provider]  fluticasone (FLONASE) 50 MCG/ACT nasal spray Place 2 sprays into both nostrils daily. 08/28/20  Yes [provider]  folic acid (FOLVITE) 1 MG tablet Take 400 mg by mouth daily.    Yes [provider]  hydrochlorothiazide (MICROZIDE) 12.5 MG capsule TAKE 1 CAPSULE(12.5 MG) BY MOUTH DAILY Patient taking differently: Take 12.5 mg by mouth daily. 10/04/19  Yes Corum, Rex Kras, MD  levothyroxine (SYNTHROID) 100 MCG tablet Take 1 tablet (100 mcg total) by mouth daily before breakfast. Patient taking differently: Take 88 mcg by mouth daily before breakfast. Take 88 mcg by mouth daily 06/30/19  Yes Corum, Rex Kras, MD  losartan (COZAAR) 100 MG tablet Take 1 tablet (100 mg total) by mouth daily. 06/30/19  Yes Corum, Rex Kras, MD  montelukast (SINGULAIR) 10 MG tablet Take 1 tablet (10 mg total) by mouth at bedtime. 06/30/19  Yes Corum, Rex Kras, MD  Omega-3 Fatty Acids (FISH OIL PO) Take 2 capsules by mouth daily.   Yes [provider]  potassium chloride SA (KLOR-CON) 20 MEQ tablet TAKE 1 TABLET(20 MEQ) BY MOUTH DAILY Patient taking differently: Take 20 mEq by mouth daily. 10/26/19  Yes Corum, Rex Kras, MD  PREMARIN vaginal cream Place 0.5 Applicatorfuls vaginally 2 (two)  times a week. 07/01/19  Yes Corum, Rex Kras, MD  Sulfacetamide Sodium, Acne, 10 % LOTN Apply to affected area qd 10/04/21  Yes [place], [place] R, PA-C      Allergies    Patient has no known allergies.    Review of Systems   Review of Systems  Respiratory:  Positive for cough and shortness of breath.   Cardiovascular:  Positive for chest pain.  All other systems reviewed and are negative.   Physical Exam Updated Vital  Signs BP 113/87 (BP Location: Right Arm)   Pulse (!) 111   Temp 98.4 F (36.9 C)   Resp (!) 24   Ht 5\' 4"  (1.626 m)   Wt 74.8 kg   SpO2 94%   BMI 28.32 kg/m  Physical Exam Vitals and nursing note reviewed.  Constitutional:      General: She is in acute distress.     Appearance: She is well-developed. She is not ill-appearing, toxic-appearing or diaphoretic.  HENT:     Head: Normocephalic and atraumatic.  Eyes:     Conjunctiva/sclera: Conjunctivae normal.  Cardiovascular:     Rate and Rhythm: Normal rate and regular rhythm.     Heart sounds: No murmur heard. Pulmonary:     Effort: Pulmonary effort is normal. Tachypnea present. No respiratory distress.     Breath sounds: Decreased breath sounds and wheezing present.  Abdominal:     Palpations: Abdomen is soft.     Tenderness: There is no abdominal tenderness.  Musculoskeletal:        General: No swelling.     Cervical back: Neck supple.     Right lower leg: No edema.     Left lower leg: No edema.  Skin:    General: Skin is warm and dry.     Capillary Refill: Capillary refill takes less than 2 seconds.     Coloration: Skin is not cyanotic.     Findings: No rash.  Neurological:     Mental Status: She is alert.  Psychiatric:        Mood and Affect: Mood is anxious.     ED Results / Procedures / Treatments   Labs (all labs ordered are listed, but only abnormal results are displayed) Labs Reviewed  BASIC METABOLIC PANEL - Abnormal; Notable for the following components:      Result Value   Glucose, Bld 124 (*)    All other components within normal limits  CBC WITH DIFFERENTIAL/PLATELET - Abnormal; Notable for the following components:   WBC 11.0 (*)    Hemoglobin 15.2 (*)    HCT 46.2 (*)    Eosinophils Absolute 1.3 (*)    All other components within normal limits  RESP PANEL BY RT-PCR (RSV, FLU A&B, COVID)  [id number]  TROPONIN I (HIGH SENSITIVITY)  TROPONIN I (HIGH SENSITIVITY)    EKG None  Radiology DG Chest  1 View  Result Date: 08/18/2022 CLINICAL DATA:  Shortness of breath since last evening, labored breathing EXAM: CHEST  1 VIEW COMPARISON:  Portable exam 1452 hours compared to 07/31/2021 FINDINGS: Normal heart size, mediastinal contours, and pulmonary vascularity. Atherosclerotic calcification aorta. Lungs hyperinflated but clear. No pulmonary infiltrate, pleural effusion, or pneumothorax. Osseous structures unremarkable. IMPRESSION: Hyperinflated lungs without acute infiltrate. Aortic Atherosclerosis (ICD10-I70.0). Electronically Signed   By: Lavonia Dana M.D.   On: 08/18/2022 15:32    Procedures Procedures    Medications Ordered in ED Medications  ipratropium-albuterol (DUONEB) 0.5-2.5 (3) MG/3ML nebulizer solution (has no administration in  time range)  albuterol (PROVENTIL) (2.5 MG/3ML) 0.083% nebulizer solution (has no administration in time range)  methylPREDNISolone sodium succinate (SOLU-MEDROL) 125 mg/2 mL injection 125 mg (125 mg Intravenous Given 08/18/22 1450)  magnesium sulfate IVPB 2 g 50 mL (0 g Intravenous Stopped 08/18/22 1649)  ipratropium-albuterol (DUONEB) 0.5-2.5 (3) MG/3ML nebulizer solution 9 mL (9 mLs Nebulization Given 08/18/22 1558)  albuterol (PROVENTIL) (2.5 MG/3ML) 0.083% nebulizer solution (10 mg  Given 08/18/22 1658)    ED Course/ Medical Decision Making/ A&P Clinical Course as of 08/18/22 1733  Sun Aug 18, 2022  1628 Reassessed patient after 3 DuoNeb's.  She is still wheezing and tight with poor peak flow.  Will initiate continuous DuoNeb and admit to hospitalist [AS]  1723 Spoke with hospitalist Dr. Clearence Ped who will admit for asthma exacerbation [AS]    Clinical Course User Index [AS] Caison Hearn, [place] Folk, PA-C                             Medical Decision Making Amount and/or Complexity of Data Reviewed Labs: ordered. Radiology: ordered.  Risk Prescription drug management.  This patient presents to the ED for concern of shortness of breath, this  involves an extensive number of treatment options, and is a complaint that carries with it a high risk of complications and morbidity. The emergent differential diagnosis for shortness of breath includes, but is not limited to, Pulmonary edema, bronchoconstriction, Pneumonia, Pulmonary embolism, Pneumotherax/ Hemothorax, Dysrythmia, ACS.    Co morbidities that complicate the patient evaluation  asthma, anxiety, depression, ADHD, hypertension, hypothyroidism  My initial workup includes labs, chest x-ray, IV Solu-Medrol and magnesium, RT reads protocol  Additional history obtained from: Nursing notes from this visit. Previous records within EMR system ED visit for similar on 01/07/2021 and 03/09/2021 Family husband is present and provides a portion of the history  I ordered, reviewed and interpreted labs which include: BMP, CBC, respiratory panel, troponin.  Hyperglycemia 124, borderline leukocytosis of 11  I ordered imaging studies including chest x-ray I independently visualized and interpreted imaging which showed hyperinflation, otherwise reassuring I agree with the radiologist interpretation  Cardiac Monitoring:  The patient was maintained on a cardiac monitor.  I personally viewed and interpreted the cardiac monitored which showed an underlying rhythm of: Initially borderline sinus tachycardia which improved to baseline of 95 bpm  Consultations Obtained:  I requested consultation with the hospitalist,  and discussed lab and imaging findings as well as pertinent plan - they recommend: Admission  Afebrile, initially tachycardic and tachypneic which improved after treatment.  72 year old female presents ED for evaluation of asthma exacerbation.  On exam, she is anxious and has decreased air movement with significant inspiratory and expiratory wheezing despite reportedly using her albuterol inhaler at least 12 times.  She is not hypoxic.  No history of COPD.  Remote history of smoking.  No  risk factors for PE.  She did report some mild anterior chest wall pain after episodes of coughing but has an initial and delta troponin that is negative, EKG without ischemic changes and a chest x-ray that showed hyperinflation but was otherwise reassuring.  Patient was given IV Solu-Medrol, magnesium and 3 DuoNebs with minimal improvement in her symptoms.  She was then started on continuous nebulizer and admitted to hospitalist for continued monitoring of her asthma exacerbation.  She was not requiring supplemental oxygen or BiPAP.  Stable at the time of admission.  Patient's case discussed with Dr. Rogene Houston  who agrees with plan to admit.   Note: Portions of this report may have been transcribed using voice recognition software. Every effort was made to ensure accuracy; however, inadvertent computerized transcription errors may still be present.        Final Clinical Impression(s) / ED Diagnoses Final diagnoses:  Exacerbation of persistent asthma, unspecified asthma severity    Rx / DC Orders ED Discharge Orders     None         Nehemiah Massed 08/18/22 1733    Milton Ferguson, MD 08/20/22 1015

## 2022-08-18 NOTE — ED Notes (Signed)
Report given to [place]

## 2022-08-18 NOTE — ED Triage Notes (Signed)
Pt presents with ShOB that started last PM. Pt states she feels like her asthma has flared up that started after cutting bushes. Pt states she was using her albuterol inhaler every 2 hours. Pt with labored breathing and audible wheezes, but pt able to speak complete sentences between breath.

## 2022-08-18 NOTE — Assessment & Plan Note (Signed)
-   With significant wheezing on exam - Maintaining oxygen sats on room air - Received 3 breathing treatments followed by continuous DuoNeb in the ER - Received mag sulfate and Solu-Medrol in the ER - Continue to be tight - Continue albuterol as needed - Continue DuoNeb scheduled - Triggered by allergies, continue steroid, Pepcid, Benadryl, cetirizine, Singulair - Continue to monitor

## 2022-08-18 NOTE — H&P (Signed)
History and Physical    Patient: Olivia Arnold [id number] DOB: [birth year] DOA: 08/18/2022 DOS: the patient was seen and examined on 08/18/2022 PCP: Celene Squibb, MD  Patient coming from: Home  Chief Complaint:  Chief Complaint  Patient presents with   Shortness of Breath   HPI: Olivia Arnold is a 72 y.o. female with medical history significant of anxiety, hypertension, hypothyroidism, depression, seasonal allergies, and asthma presents the ED with a chief complaint of dyspnea.  Patient reports that she had sensitivity testing a long time ago and discovered that she was sensitive to the same weeds that she was working in the yard with yesterday.  While she was working the yard she became short of breath.  She reports at first she became very anxious, and her husband calmed her down, she was able to get her breathing under control.  She reports she was hitting her inhaler every 2 hours, but continued to wheeze.  Patient reports that she felt very tight.  She was pursed lip breathing.  She reports she could not speak in full sentences.  She was coughing and that was productive of clear sputum.  She could not sleep, could not eat because she was so short of breath.  She reports she has been compliant with all of her medications at home including the anti-histamine, Singulair, inhaler, and more.  She reports that she had associated chest tightness and discomfort.  She did feel near syncopal once when it first started.  Patient reports she has never been intubated or even hospitalized for asthma in the past.  She has had asthma exacerbations that have been treated outpatient.  Patient reports no sick contacts and no URI symptoms..  She was otherwise in her normal state of health before this episode started.  Patient has no other complaints at this time.  Patient does not smoke, does not drink.  She is vaccinated for COVID and flu.  Patient is full code. Review of Systems: As mentioned in the history  of present illness. All other systems reviewed and are negative. 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)   Atypical mole 08/23/2013   Right Post Shoulder, Sup - moderate   Atypical mole 08/23/2013   Right Post Shoulder, Inf - moderate   Atypical mole 08/23/2013   Left Upper Shoulder, Post - mild   Chest pain, unspecified    Depression    Essential (primary) hypertension    Essential hypertension    History of cardiac catheterization    [facility] July 2000 - normal coronaries   Hypothyroidism    Hypothyroidism, unspecified    Major depressive disorder, single episode, unspecified    Rosacea, unspecified    Unspecified asthma, uncomplicated    Past Surgical History:  Procedure Laterality Date   CATARACT EXTRACTION Bilateral 2018   PARATHYROIDECTOMY     THYROIDECTOMY     TONSILLECTOMY     Social History:  reports that she has quit smoking. Her smoking use included cigarettes. She started smoking about 53 years ago. She smoked an average of 1 pack per day. She has never used smokeless tobacco. She reports that  she does not currently use alcohol. She reports that she does not use drugs.  No Known Allergies  Family History  Problem Relation Age of Onset   Heart attack Brother        Died age 61   CAD Brother        Diagnosed age 10   Aneurysm Mother        Brain   Breast cancer Sister 36   Melanoma Sister    Basal cell carcinoma Father     Prior to Admission medications   Medication Sig Start Date End Date Taking? Authorizing Provider  albuterol (VENTOLIN HFA) 108 (90 Base) MCG/ACT inhaler Inhale 2 puffs into the lungs every 6 (six) hours as needed for wheezing or shortness  of breath. 07/31/21  Yes Martyn Ehrich, NP  atorvastatin (LIPITOR) 20 MG tablet Take 20 mg by mouth at bedtime. 09/08/21  Yes [provider]  augmented betamethasone dipropionate (DIPROLENE-AF) 0.05 % ointment APPLY TOPICALLY DAILY AS DIRECTED. Patient taking differently: 1 Application daily. 12/31/21  Yes Lavonna Monarch, MD  cholecalciferol (VITAMIN D3) 25 MCG (1000 UT) tablet Take 1,000 Units by mouth daily.   Yes [provider]  DULoxetine (CYMBALTA) 60 MG capsule Take 60 mg by mouth daily. 08/31/21  Yes [provider]  fluticasone (FLONASE) 50 MCG/ACT nasal spray Place 2 sprays into both nostrils daily. 08/28/20  Yes [provider]  folic acid (FOLVITE) 1 MG tablet Take 400 mg by mouth daily.    Yes [provider]  hydrochlorothiazide (MICROZIDE) 12.5 MG capsule TAKE 1 CAPSULE(12.5 MG) BY MOUTH DAILY Patient taking differently: Take 12.5 mg by mouth daily. 10/04/19  Yes Corum, Rex Kras, MD  levothyroxine (SYNTHROID) 100 MCG tablet Take 1 tablet (100 mcg total) by mouth daily before breakfast. Patient taking differently: Take 88 mcg by mouth daily before breakfast. Take 88 mcg by mouth daily 06/30/19  Yes Corum, Rex Kras, MD  losartan (COZAAR) 100 MG tablet Take 1 tablet (100 mg total) by mouth daily. 06/30/19  Yes Corum, Rex Kras, MD  montelukast (SINGULAIR) 10 MG tablet Take 1 tablet (10 mg total) by mouth at bedtime. 06/30/19  Yes Corum, Rex Kras, MD  Omega-3 Fatty Acids (FISH OIL PO) Take 2 capsules by mouth daily.   Yes [provider]  potassium chloride SA (KLOR-CON) 20 MEQ tablet TAKE 1 TABLET(20 MEQ) BY MOUTH DAILY Patient taking differently: Take 20 mEq by mouth daily. 10/26/19  Yes Corum, Rex Kras, MD  PREMARIN vaginal cream Place 0.5 Applicatorfuls vaginally 2 (two) times a week. 07/01/19  Yes Corum, Rex Kras, MD  Sulfacetamide Sodium, Acne, 10 % LOTN Apply to affected area qd 10/04/21  Yes Warren Danes, [place]    Physical Exam: Vitals:    08/18/22 1630 08/18/22 1700 08/18/22 1730 08/18/22 1835  BP: (!) 168/71 (!) 148/57 (!) 139/98 (!) 161/44  Pulse: (!) 103 100 (!) 105 (!) 123  Resp: (!) 22 (!) 21 (!) 22 20  Temp:    98.3 F (36.8 C)  TempSrc:    Oral  SpO2: 92% 100% 100% 93%  Weight:    71.6 kg  Height:    5\' 4"  (1.626 m)   1.  General: Patient lying supine in bed,  no acute distress   2. Psychiatric: Alert and oriented x 3, mood and behavior normal for situation, pleasant and cooperative with exam   3. Neurologic: Speech and language are normal, face is symmetric, moves all 4 extremities voluntarily, at  baseline without acute deficits on limited exam   4. HEENMT:  Head is atraumatic, normocephalic, pupils reactive to light, neck is supple, trachea is midline, mucous membranes are moist   5. Respiratory : Mild wheezing in the bilateral lung fields, no, rhonchi, no rales, no cyanosis, no increase in work of breathing or accessory muscle use, speaking in full sentences, oxygen sats from 88-90% on room air   6. Cardiovascular : Heart rate tachycardic, rhythm is regular, no murmurs, rubs or gallops, no peripheral edema, peripheral pulses palpated   7. Gastrointestinal:  Abdomen is soft, nondistended, nontender to palpation bowel sounds active, no masses or organomegaly palpated   8. Skin:  Skin is warm, dry and intact without rashes, acute lesions, or ulcers on limited exam   9.Musculoskeletal:  No acute deformities or trauma, no asymmetry in tone, no peripheral edema, peripheral pulses palpated, no tenderness to palpation in the extremities  Data Reviewed: In the ED Temp 98.4, heart rate 111, respiratory rate 24, blood pressure 113/87, satting at 94% Borderline leukocytosis 11.0, hemoglobin 15.2 Chemistries unremarkable Troponin 4, 3 COVID and flu pending Chest x-ray shows signs of hyperinflation EKG shows a heart rate of 112, sinus tach, QTc 453 Patient was given 3 breathing treatments and then a  continuous neb, mag sulfate, Solu-Medrol in the ED Admission requested for asthma exacerbation  Assessment and Plan: * Asthma exacerbation - With significant wheezing on exam - Maintaining oxygen sats on room air - Received 3 breathing treatments followed by continuous DuoNeb in the ER - Received mag sulfate and Solu-Medrol in the ER - Continue to be tight - Continue albuterol as needed - Continue DuoNeb scheduled - Triggered by allergies, continue steroid, Pepcid, Benadryl, cetirizine, Singulair - Continue to monitor  Mixed hyperlipidemia - Continue statin  Allergies - Likely the trigger of the asthma exacerbation - Continue steroid, Pepcid, Benadryl as needed - Continue home Singulair - Continue to monitor  Essential (primary) hypertension - Continue hydrochlorothiazide  Hypothyroidism, adult - Continue Synthroid      Advance Care Planning:   Code Status: Full Code  Consults: None at this time  Family Communication: No family at bedside  Severity of Illness: The appropriate patient status for this patient is OBSERVATION. Observation status is judged to be reasonable and necessary in order to provide the required intensity of service to ensure the patient's safety. The patient's presenting symptoms, physical exam findings, and initial radiographic and laboratory data in the context of their medical condition is felt to place them at decreased risk for further clinical deterioration. Furthermore, it is anticipated that the patient will be medically stable for discharge from the hospital within 2 midnights of admission.   Author: Rolla Plate, DO 08/18/2022 7:36 PM  For on call review [email or web address].

## 2022-08-18 NOTE — Assessment & Plan Note (Signed)
Continue statin. 

## 2022-08-19 DIAGNOSIS — J45901 Unspecified asthma with (acute) exacerbation: Secondary | ICD-10-CM | POA: Diagnosis not present

## 2022-08-19 LAB — COMPREHENSIVE METABOLIC PANEL
ALT: 26 U/L (ref 0–44)
AST: 26 U/L (ref 15–41)
Albumin: 4.1 g/dL (ref 3.5–5.0)
Alkaline Phosphatase: 71 U/L (ref 38–126)
Anion gap: 12 (ref 5–15)
BUN: 20 mg/dL (ref 8–23)
CO2: 25 mmol/L (ref 22–32)
Calcium: 9.7 mg/dL (ref 8.9–10.3)
Chloride: 103 mmol/L (ref 98–111)
Creatinine, Ser: 0.7 mg/dL (ref 0.44–1.00)
GFR, Estimated: >60 mL/min (ref >60)
Glucose, Bld: 214 mg/dL — ABNORMAL HIGH (ref 70–99)
Potassium: 3.8 mmol/L (ref 3.5–5.1)
Sodium: 140 mmol/L (ref 135–145)
Total Bilirubin: 0.8 mg/dL (ref 0.3–1.2)
Total Protein: 6.9 g/dL (ref 6.5–8.1)

## 2022-08-19 LAB — CBC WITH DIFFERENTIAL/PLATELET
Abs Immature Granulocytes: 0.03 10*3/uL (ref 0.00–0.07)
Basophils Absolute: 0 10*3/uL (ref 0.0–0.1)
Basophils Relative: 0 %
Eosinophils Absolute: 0 10*3/uL (ref 0.0–0.5)
Eosinophils Relative: 0 %
HCT: 43.3 % (ref 36.0–46.0)
Hemoglobin: 13.8 g/dL (ref 12.0–15.0)
Immature Granulocytes: 0 %
Lymphocytes Relative: 11 %
Lymphs Abs: 1.2 10*3/uL (ref 0.7–4.0)
MCH: 30.7 pg (ref 26.0–34.0)
MCHC: 31.9 g/dL (ref 30.0–36.0)
MCV: 96.4 fL (ref 80.0–100.0)
Monocytes Absolute: 0.1 10*3/uL (ref 0.1–1.0)
Monocytes Relative: 1 %
Neutro Abs: 9.7 10*3/uL — ABNORMAL HIGH (ref 1.7–7.7)
Neutrophils Relative %: 88 %
Platelets: 356 10*3/uL (ref 150–400)
RBC: 4.49 MIL/uL (ref 3.87–5.11)
RDW: 12.6 % (ref 11.5–15.5)
WBC: 11.1 10*3/uL — ABNORMAL HIGH (ref 4.0–10.5)
nRBC: 0 % (ref 0.0–0.2)

## 2022-08-19 LAB — EXPECTORATED SPUTUM ASSESSMENT W GRAM STAIN, RFLX TO RESP C

## 2022-08-19 LAB — MAGNESIUM: Magnesium: 2.2 mg/dL (ref 1.7–2.4)

## 2022-08-19 MED ORDER — MONTELUKAST SODIUM 10 MG PO TABS: 10.0000 mg | ORAL_TABLET | Freq: Every day | ORAL | 3 refills | Status: AC

## 2022-08-19 MED ORDER — METHYLPREDNISOLONE 4 MG PO TBPK: ORAL_TABLET | ORAL | 0 refills | Status: DC

## 2022-08-19 MED ORDER — FAMOTIDINE 20 MG PO TABS
20.0000 mg | ORAL_TABLET | Freq: Every day | ORAL | Status: DC
  Administered 2022-08-19: 20 mg via ORAL
  Filled 2022-08-19: qty 1

## 2022-08-19 MED ORDER — FAMOTIDINE 20 MG PO TABS: 20.0000 mg | ORAL_TABLET | Freq: Every day | ORAL | 0 refills | Status: DC

## 2022-08-19 MED ORDER — LEVOCETIRIZINE DIHYDROCHLORIDE 5 MG PO TABS: 5.0000 mg | ORAL_TABLET | Freq: Every evening | ORAL | 1 refills | Status: DC

## 2022-08-19 MED ORDER — ALBUTEROL SULFATE HFA 108 (90 BASE) MCG/ACT IN AERS: 2.0000 | INHALATION_SPRAY | Freq: Four times a day (QID) | RESPIRATORY_TRACT | 3 refills | Status: AC | PRN

## 2022-08-19 NOTE — Discharge Summary (Signed)
Physician Discharge Summary   Patient: Olivia Arnold MRN: [id number] DOB: 11-01-1950  Admit date:     08/18/2022  Discharge date: 08/19/22  Discharge Physician: Deatra James   PCP: Celene Squibb, MD   Recommendations at discharge:  Follow-up with the PCP in 1 week Follow-up with primary pulmonologist in 2-4 weeks Continue current newly recommended medication including taper down steroids, inhalers,  Discharge Diagnoses: Principal Problem:   Asthma exacerbation Active Problems:   Hypothyroidism, adult   Essential (primary) hypertension   Allergies   Mixed hyperlipidemia  Resolved Problems:   * No resolved hospital problems. * Olivia Arnold is a 72 y.o. female with medical history significant of anxiety, hypertension, hypothyroidism, depression, seasonal allergies, and asthma presents the ED with a chief complaint of dyspnea.  Patient reports that she had sensitivity testing a long time ago and discovered that she was sensitive to the same weeds that she was working in the yard with yesterday.  While she was working the yard she became short of breath.  She reports at first she became very anxious, and her husband calmed her down, she was able to get her breathing under control.  She reports she was hitting her inhaler every 2 hours, but continued to wheeze.  Patient reports that she felt very tight.  She was pursed lip breathing.  She reports she could not speak in full sentences.  She was coughing and that was productive of clear sputum.  She could not sleep, could not eat because she was so short of breath.  She reports she has been compliant with all of her medications at home including the anti-histamine, Singulair, inhaler, and more.  She reports that she had associated chest tightness and discomfort.  She did feel near syncopal once when it first started.  Patient reports she has never been intubated or even hospitalized for asthma in the past.  She has had asthma exacerbations  that have been treated outpatient.  Patient reports no sick contacts and no URI symptoms..  She was otherwise in her normal state of health before this episode started.  Patient has no other complaints at this time.    * Asthma exacerbation -Much improved, off IV steroids, will taper down with p.o. prednisone -Continue home inhalers -Continue O2 sat greater 94% on room air  -On this admission was treated aggressively IV steroids, DuoNeb bronchodilators, supplemental oxygen, which was tapered off, - Improved wheezing, and shortness of breath - Triggered by allergies, continue steroid, Pepcid, Benadryl, cetirizine, Singulair - Continue to monitor  Mixed hyperlipidemia - Continue statin  Allergies - Likely the trigger of the asthma exacerbation - Continue steroid, Pepcid, Benadryl as needed - Continue home Singulair - Continue to monitor  Essential (primary) hypertension - Continue hydrochlorothiazide  Hypothyroidism, adult - Continue Synthroid      Disposition: Home Diet recommendation:  Discharge Diet Orders (From admission, onward)     Start     Ordered   08/19/22 0000  Diet - low sodium heart healthy        08/19/22 1122           Regular diet DISCHARGE MEDICATION: Allergies as of 08/19/2022   No Known Allergies      Medication List     STOP taking these medications    folic acid 1 MG tablet Commonly known as: FOLVITE       TAKE these medications    albuterol 108 (90 Base) MCG/ACT inhaler Commonly known as: VENTOLIN  HFA Inhale 2 puffs into the lungs every 6 (six) hours as needed for wheezing or shortness of breath.   atorvastatin 20 MG tablet Commonly known as: LIPITOR Take 20 mg by mouth at bedtime.   augmented betamethasone dipropionate 0.05 % ointment Commonly known as: DIPROLENE-AF APPLY TOPICALLY DAILY AS DIRECTED. What changed: See the new instructions.   cholecalciferol 25 MCG (1000 UNIT) tablet Commonly known as: VITAMIN D3 Take  1,000 Units by mouth daily.   DULoxetine 60 MG capsule Commonly known as: CYMBALTA Take 60 mg by mouth daily.   famotidine 20 MG tablet Commonly known as: PEPCID Take 1 tablet (20 mg total) by mouth daily. Start taking on: August 20, 2022   FISH OIL PO Take 2 capsules by mouth daily.   fluticasone 50 MCG/ACT nasal spray Commonly known as: FLONASE Place 2 sprays into both nostrils daily.   hydrochlorothiazide 12.5 MG capsule Commonly known as: MICROZIDE TAKE 1 CAPSULE(12.5 MG) BY MOUTH DAILY What changed: See the new instructions.   levocetirizine 5 MG tablet Commonly known as: XYZAL Take 1 tablet (5 mg total) by mouth every evening.   levothyroxine 100 MCG tablet Commonly known as: SYNTHROID Take 1 tablet (100 mcg total) by mouth daily before breakfast. What changed:  how much to take additional instructions   losartan 100 MG tablet Commonly known as: COZAAR Take 1 tablet (100 mg total) by mouth daily.   methylPREDNISolone 4 MG Tbpk tablet Commonly known as: MEDROL DOSEPAK Medrol Dosepak take as instructed   montelukast 10 MG tablet Commonly known as: SINGULAIR Take 1 tablet (10 mg total) by mouth at bedtime.   potassium chloride SA 20 MEQ tablet Commonly known as: KLOR-CON M TAKE 1 TABLET(20 MEQ) BY MOUTH DAILY What changed: See the new instructions.   Premarin vaginal cream Generic drug: conjugated estrogens Place 0.5 Applicatorfuls vaginally 2 (two) times a week.   Sulfacetamide Sodium (Acne) 10 % Lotn Apply to affected area qd        Discharge Exam: Filed Weights   08/18/22 1404 08/18/22 1835  Weight: 74.8 kg 71.6 kg        General:  AAO x 3,  cooperative, no distress;   HEENT:  Normocephalic, PERRL, otherwise with in Normal limits   Neuro:  CNII-XII intact. , normal motor and sensation, reflexes intact   Lungs:   Clear to auscultation BL, Respirations unlabored,  No wheezes / crackles  Cardio:    S1/S2, RRR, No murmure, No Rubs or  Gallops   Abdomen:  Soft, non-tender, bowel sounds active all four quadrants, no guarding or peritoneal signs.  Muscular  skeletal:  Limited exam -global generalized weaknesses - in bed, able to move all 4 extremities,   2+ pulses,  symmetric, No pitting edema  Skin:  Dry, warm to touch, negative for any Rashes,  Wounds: Please see nursing documentation          Condition at discharge: good  The results of significant diagnostics from this hospitalization (including imaging, microbiology, ancillary and laboratory) are listed below for reference.   Imaging Studies: DG Chest 1 View  Result Date: 08/18/2022 CLINICAL DATA:  Shortness of breath since last evening, labored breathing EXAM: CHEST  1 VIEW COMPARISON:  Portable exam 1452 hours compared to 07/31/2021 FINDINGS: Normal heart size, mediastinal contours, and pulmonary vascularity. Atherosclerotic calcification aorta. Lungs hyperinflated but clear. No pulmonary infiltrate, pleural effusion, or pneumothorax. Osseous structures unremarkable. IMPRESSION: Hyperinflated lungs without acute infiltrate. Aortic Atherosclerosis (ICD10-I70.0). Electronically Signed   By:  Lavonia Dana M.D.   On: 08/18/2022 15:32    Microbiology: Results for orders placed or performed during the hospital encounter of 08/18/22  Resp panel by RT-PCR (RSV, Flu A&B, Covid) Anterior Nasal Swab     Status: None   Collection Time: 08/18/22  2:34 PM   Specimen: Anterior Nasal Swab  Result Value Ref Range Status   SARS Coronavirus 2 by RT PCR NEGATIVE NEGATIVE Final    Comment: (NOTE) SARS-CoV-2 target nucleic acids are NOT DETECTED.  The SARS-CoV-2 RNA is generally detectable in upper respiratory specimens during the acute phase of infection. The lowest concentration of SARS-CoV-2 viral copies this assay can detect is 138 copies/mL. A negative result does not preclude SARS-Cov-2 infection and should not be used as the sole basis for treatment or other patient  management decisions. A negative result may occur with  improper specimen collection/handling, submission of specimen other than nasopharyngeal swab, presence of viral mutation(s) within the areas targeted by this assay, and inadequate number of viral copies(<138 copies/mL). A negative result must be combined with clinical observations, patient history, and epidemiological information. The expected result is Negative.  Fact Sheet for Patients:  EntrepreneurPulse.com.au  Fact Sheet for Healthcare Providers:  IncredibleEmployment.be  This test is no t yet approved or cleared by the [place] FDA and  has been authorized for detection and/or diagnosis of SARS-CoV-2 by FDA under an Emergency Use Authorization (EUA). This EUA will remain  in effect (meaning this test can be used) for the duration of the COVID-19 declaration under Section 564(b)(1) of the Act, 21 U.S.C.section 360bbb-3(b)(1), unless the authorization is terminated  or revoked sooner.       Influenza A by PCR NEGATIVE NEGATIVE Final   Influenza B by PCR NEGATIVE NEGATIVE Final    Comment: (NOTE) The Xpert Xpress SARS-CoV-2/FLU/RSV plus assay is intended as an aid in the diagnosis of influenza from Nasopharyngeal swab specimens and should not be used as a sole basis for treatment. Nasal washings and aspirates are unacceptable for Xpert Xpress SARS-CoV-2/FLU/RSV testing.  Fact Sheet for Patients: EntrepreneurPulse.com.au  Fact Sheet for Healthcare Providers: IncredibleEmployment.be  This test is not yet approved or cleared by the [place] FDA and has been authorized for detection and/or diagnosis of SARS-CoV-2 by FDA under an Emergency Use Authorization (EUA). This EUA will remain in effect (meaning this test can be used) for the duration of the COVID-19 declaration under Section 564(b)(1) of the Act, 21 U.S.C. section 360bbb-3(b)(1),  unless the authorization is terminated or revoked.     Resp Syncytial Virus by PCR NEGATIVE NEGATIVE Final    Comment: (NOTE) Fact Sheet for Patients: EntrepreneurPulse.com.au  Fact Sheet for Healthcare Providers: IncredibleEmployment.be  This test is not yet approved or cleared by the [place] FDA and has been authorized for detection and/or diagnosis of SARS-CoV-2 by FDA under an Emergency Use Authorization (EUA). This EUA will remain in effect (meaning this test can be used) for the duration of the COVID-19 declaration under Section 564(b)(1) of the Act, 21 U.S.C. section 360bbb-3(b)(1), unless the authorization is terminated or revoked.  Performed at [facility], [address] [place], [place] [address]     Labs: CBC: Recent Labs  Lab 08/18/22 1434 08/19/22 0409  WBC 11.0* 11.1*  NEUTROABS 6.6 9.7*  HGB 15.2* 13.8  HCT 46.2* 43.3  MCV 94.3 96.4  PLT 356 [id number]   Basic Metabolic Panel: Recent Labs  Lab 08/18/22 1434 08/19/22 0409  NA 138 140  K  3.7 3.8  CL 103 103  CO2 26 25  GLUCOSE 124* 214*  BUN 18 20  CREATININE 0.67 0.70  CALCIUM 9.5 9.7  MG  --  2.2   Liver Function Tests: Recent Labs  Lab 08/19/22 0409  AST 26  ALT 26  ALKPHOS 71  BILITOT 0.8  PROT 6.9  ALBUMIN 4.1   CBG: No results for input(s): "GLUCAP" in the last 168 hours.  Discharge time spent: greater than 30 minutes.  Signed: Deatra James, MD [facility] 08/19/2022

## 2022-08-19 NOTE — Progress Notes (Signed)
  Transition of Care [facility]) Screening Note   Patient Details  Name: Olivia Arnold Date of Birth: January 08, 1951   Transition of Care [facility]) CM/SW Contact:    Iona Beard, [place] Phone Number: 08/19/2022, 10:30 AM    Transition of Care Department [facility]) has reviewed patient and no TOC needs have been identified at this time. We will continue to monitor patient advancement through interdisciplinary progression rounds. If new patient transition needs arise, please place a TOC consult.

## 2022-08-21 DIAGNOSIS — K219 Gastro-esophageal reflux disease without esophagitis: Secondary | ICD-10-CM | POA: Insufficient documentation

## 2022-09-03 DIAGNOSIS — H35033 Hypertensive retinopathy, bilateral: Secondary | ICD-10-CM | POA: Diagnosis not present

## 2022-09-23 DIAGNOSIS — H903 Sensorineural hearing loss, bilateral: Secondary | ICD-10-CM | POA: Diagnosis not present

## 2022-10-07 DIAGNOSIS — E785 Hyperlipidemia, unspecified: Secondary | ICD-10-CM | POA: Diagnosis not present

## 2022-10-07 DIAGNOSIS — Z114 Encounter for screening for human immunodeficiency virus [HIV]: Secondary | ICD-10-CM | POA: Diagnosis not present

## 2022-10-07 DIAGNOSIS — I700 Atherosclerosis of aorta: Secondary | ICD-10-CM | POA: Diagnosis not present

## 2022-10-07 DIAGNOSIS — I2510 Atherosclerotic heart disease of native coronary artery without angina pectoris: Secondary | ICD-10-CM | POA: Diagnosis not present

## 2022-10-07 DIAGNOSIS — J4530 Mild persistent asthma, uncomplicated: Secondary | ICD-10-CM | POA: Diagnosis not present

## 2022-10-07 DIAGNOSIS — Z1159 Encounter for screening for other viral diseases: Secondary | ICD-10-CM | POA: Diagnosis not present

## 2022-10-07 DIAGNOSIS — F419 Anxiety disorder, unspecified: Secondary | ICD-10-CM | POA: Diagnosis not present

## 2022-10-07 DIAGNOSIS — I10 Essential (primary) hypertension: Secondary | ICD-10-CM | POA: Diagnosis not present

## 2022-10-07 DIAGNOSIS — Z136 Encounter for screening for cardiovascular disorders: Secondary | ICD-10-CM | POA: Diagnosis not present

## 2022-10-07 DIAGNOSIS — E663 Overweight: Secondary | ICD-10-CM | POA: Diagnosis not present

## 2022-10-07 DIAGNOSIS — Z79899 Other long term (current) drug therapy: Secondary | ICD-10-CM | POA: Diagnosis not present

## 2022-10-07 DIAGNOSIS — E559 Vitamin D deficiency, unspecified: Secondary | ICD-10-CM | POA: Diagnosis not present

## 2022-10-07 DIAGNOSIS — E039 Hypothyroidism, unspecified: Secondary | ICD-10-CM | POA: Diagnosis not present

## 2022-10-15 ENCOUNTER — Ambulatory Visit: Payer: Medicare PPO | Admitting: General Surgery

## 2022-10-15 ENCOUNTER — Encounter: Payer: Self-pay | Admitting: General Surgery

## 2022-10-15 VITALS — BP 108/70 | HR 77 | Temp 97.9°F | Resp 14 | Ht 64.0 in | Wt 164.0 lb

## 2022-10-15 DIAGNOSIS — D1721 Benign lipomatous neoplasm of skin and subcutaneous tissue of right arm: Secondary | ICD-10-CM | POA: Diagnosis not present

## 2022-10-15 NOTE — Patient Instructions (Signed)
Call if something changes.  Lipoma Removal  Lipoma removal is a surgical procedure to remove a lipoma, which is a noncancerous (benign) tumor that is made up of fat cells. Most lipomas are small and painless and do not require treatment. They can form in many areas of the body but are most common under the skin of the back, arms, shoulders, buttocks, and thighs. You may need lipoma removal if you have a lipoma that is large, growing, or causing discomfort. Lipoma removal may also be done for cosmetic reasons. Tell a health care provider about: Any allergies you have. All medicines you are taking, including vitamins, herbs, eye drops, creams, and over-the-counter medicines. Any problems you or family members have had with anesthetic medicines. Any bleeding problems you have. Any surgeries you have had. Any medical conditions you have. Whether you are pregnant or may be pregnant. What are the risks? Generally, this is a safe procedure. However, problems may occur, including: Infection. Bleeding. Scarring. Allergic reactions to medicines. Damage to nearby structures or organs, such as damage to nerves or blood vessels near the lipoma. What happens before the procedure?  Medicines Ask your health care provider about: Changing or stopping your regular medicines. This is especially important if you are taking diabetes medicines or blood thinners. Taking medicines such as aspirin and ibuprofen. These medicines can thin your blood. Do not take these medicines unless your health care provider tells you to take them. Taking over-the-counter medicines, vitamins, herbs, and supplements. General instructions You will have a physical exam. Your health care provider will check the size of the lipoma and whether it can be removed easily. You may have a biopsy and imaging tests, such as X-rays, a CT scan, and an MRI. Do not use any products that contain nicotine or tobacco for at least 4 weeks before  the procedure. These products include cigarettes, chewing tobacco, and vaping devices, such as e-cigarettes. If you need help quitting, ask your health care provider. Ask your health care provider: How your surgery site will be marked. What steps will be taken to help prevent infection. These may include: Washing skin with a germ-killing soap. Taking antibiotic medicine. If you will be going home right after the procedure, plan to have a responsible adult: Take you home from the hospital or clinic. You will not be allowed to drive. Care for you for the time you are told. What happens during the procedure?  An IV will be inserted into one of your veins. You will be given one or more of the following: A medicine to help you relax (sedative). A medicine to numb the area (local anesthetic). A medicine to make you fall asleep (general anesthetic). A medicine that is injected into an area of your body to numb everything below the injection site (regional anesthetic). An incision will be made into the skin over the lipoma or very near the lipoma. The incision may be made in a natural skin line or crease. Tissues, nerves, and blood vessels near the lipoma will be moved out of the way. The lipoma and the capsule that surrounds it will be separated from the surrounding tissues. The lipoma will be removed. The incision may be closed with stitches (sutures). A bandage (dressing) will be placed over the incision. The procedure may vary among health care providers and hospitals. What happens after the procedure? Your blood pressure, heart rate, breathing rate, and blood oxygen level will be monitored until you leave the hospital or clinic. If  you were prescribed an antibiotic medicine, use it as told by your health care provider. Do not stop using the antibiotic even if you start to feel better. If you were given a sedative during the procedure, it can affect you for several hours. Do not drive or  operate machinery until your health care provider says that it is safe. Where to find more information OrthoInfo: orthoinfo.aaos.org Summary Before the procedure, follow instructions from your health care provider about eating and drinking, and changing or stopping your regular medicines. This is especially important if you are taking diabetes medicines or blood thinners. After the lipoma is removed, the incision may be closed with stitches (sutures) and covered with a bandage (dressing). If you were given a sedative during the procedure, it can affect you for several hours. Do not drive or operate machinery until your health care provider says that it is safe. This information is not intended to replace advice given to you by your health care provider. Make sure you discuss any questions you have with your health care provider. Document Revised: 06/01/2021 Document Reviewed: 06/01/2021 Elsevier Patient Education  2023 [employer].

## 2022-10-15 NOTE — Progress Notes (Signed)
[facility] History and Physical  Reason for Referral:*** Referring Physician: ***  Chief Complaint   Lipoma on right shoulder - wants removed     Olivia Arnold is a 72 y.o. female.  HPI: ***.  The *** started *** and has had a duration of ***.  It is associated with ***.  The *** is improved with ***, and is made worse with ***.    Quality*** Context***  Past Medical History:  Diagnosis Date  . ADHD (attention deficit hyperactivity disorder)   . Anxiety   . Anxiety disorder, unspecified   . Asthma   . Atypical mole 11/09/2003   Right Scapula - minimal  . Atypical mole 10/10/2008   Right Mid Paraspinal - slight to moderate   . Atypical mole 12/06/2008   Left Chest - moderate to severe [place] [facility])  . Atypical mole 08/23/2013   Right Post Shoulder, Sup - moderate  . Atypical mole 08/23/2013   Right Post Shoulder, Inf - moderate  . Atypical mole 08/23/2013   Left Upper Shoulder, Post - mild  . Chest pain, unspecified   . Depression   . Essential (primary) hypertension   . Essential hypertension   . History of cardiac catheterization    [facility] July 2000 - normal coronaries  . Hypothyroidism   . Hypothyroidism, unspecified   . Major depressive disorder, single episode, unspecified   . Rosacea, unspecified   . Unspecified asthma, uncomplicated     Past Surgical History:  Procedure Laterality Date  . CATARACT EXTRACTION Bilateral 2018  . PARATHYROIDECTOMY    . THYROIDECTOMY    . TONSILLECTOMY      Family History  Problem Relation Age of Onset  . Heart attack Brother        Died age 50  . CAD Brother        Diagnosed age 71  . Aneurysm Mother        Brain  . Breast cancer Sister 36  . Melanoma Sister   . Basal cell carcinoma Father     Social History   Tobacco Use  . Smoking status: Former    Packs/day: 1    Types: Cigarettes    Start date: 09/20/1968  . Smokeless tobacco: Never  Vaping Use  . Vaping Use: Never used  Substance Use  Topics  . Alcohol use: Not Currently    Alcohol/week: 0.0 standard drinks of alcohol  . Drug use: No    Medications: {medication reviewed/display:3041432} Allergies as of 10/15/2022   No Known Allergies      Medication List        Accurate as of Oct 15, 2022  9:50 AM. If you have any questions, ask your nurse or doctor.          STOP taking these medications    FISH OIL PO Stopped by: Lucretia Roers, MD   methylPREDNISolone 4 MG Tbpk tablet Commonly known as: MEDROL DOSEPAK Stopped by: Lucretia Roers, MD   potassium chloride SA 20 MEQ tablet Commonly known as: KLOR-CON M Stopped by: Lucretia Roers, MD       TAKE these medications    albuterol 108 (90 Base) MCG/ACT inhaler Commonly known as: VENTOLIN HFA Inhale 2 puffs into the lungs every 6 (six) hours as needed for wheezing or shortness of breath.   atorvastatin 20 MG tablet Commonly known as: LIPITOR Take 20 mg by mouth at bedtime.   augmented betamethasone dipropionate 0.05 % ointment Commonly known as: DIPROLENE-AF APPLY  TOPICALLY DAILY AS DIRECTED. What changed: See the new instructions.   cholecalciferol 25 MCG (1000 UNIT) tablet Commonly known as: VITAMIN D3 Take 1,000 Units by mouth daily.   DULoxetine 60 MG capsule Commonly known as: CYMBALTA Take 60 mg by mouth daily.   famotidine 20 MG tablet Commonly known as: PEPCID Take 1 tablet (20 mg total) by mouth daily.   fluticasone 50 MCG/ACT nasal spray Commonly known as: FLONASE Place 2 sprays into both nostrils daily.   hydrochlorothiazide 12.5 MG capsule Commonly known as: MICROZIDE TAKE 1 CAPSULE(12.5 MG) BY MOUTH DAILY What changed: See the new instructions.   levocetirizine 5 MG tablet Commonly known as: XYZAL Take 1 tablet (5 mg total) by mouth every evening.   levothyroxine 100 MCG tablet Commonly known as: SYNTHROID Take 1 tablet (100 mcg total) by mouth daily before breakfast. What changed:  how much to  take additional instructions   losartan 100 MG tablet Commonly known as: COZAAR Take 1 tablet (100 mg total) by mouth daily.   montelukast 10 MG tablet Commonly known as: SINGULAIR Take 1 tablet (10 mg total) by mouth at bedtime.   pantoprazole 40 MG tablet Commonly known as: PROTONIX Take 40 mg by mouth daily.   Premarin vaginal cream Generic drug: conjugated estrogens Place 0.5 Applicatorfuls vaginally 2 (two) times a week.   Sulfacetamide Sodium (Acne) 10 % Lotn Apply to affected area qd         ROS:  {Review of Systems:30496}  Blood pressure 108/70, pulse 77, temperature 97.9 F (36.6 C), temperature source Oral, resp. rate 14, height 5\' 4"  (1.626 m), weight 164 lb (74.4 kg), SpO2 97 %. Physical Exam  Results: No results found for this or any previous visit (from the past 48 hour(s)).  No results found.   Assessment & Plan:  Olivia Arnold is a 72 y.o. female with *** -*** -*** -Follow up ***  Future Appointments  Date Time Provider Department Center  01/30/2023  3:00 PM Lucretia Roers, MD [facility] None    All questions were answered to the satisfaction of the patient and family***.  The risk and benefits of *** were discussed including but not limited to ***.  After careful consideration, Olivia Arnold has decided to ***.    Lucretia Roers 10/15/2022, 9:50 AM

## 2022-10-23 DIAGNOSIS — E039 Hypothyroidism, unspecified: Secondary | ICD-10-CM | POA: Diagnosis not present

## 2022-10-23 DIAGNOSIS — D1721 Benign lipomatous neoplasm of skin and subcutaneous tissue of right arm: Secondary | ICD-10-CM | POA: Diagnosis not present

## 2022-10-23 DIAGNOSIS — E559 Vitamin D deficiency, unspecified: Secondary | ICD-10-CM | POA: Diagnosis not present

## 2022-10-23 DIAGNOSIS — Z0001 Encounter for general adult medical examination with abnormal findings: Secondary | ICD-10-CM | POA: Diagnosis not present

## 2022-10-23 DIAGNOSIS — E785 Hyperlipidemia, unspecified: Secondary | ICD-10-CM | POA: Diagnosis not present

## 2022-10-23 DIAGNOSIS — R7303 Prediabetes: Secondary | ICD-10-CM | POA: Diagnosis not present

## 2022-10-23 DIAGNOSIS — E663 Overweight: Secondary | ICD-10-CM | POA: Diagnosis not present

## 2022-10-23 DIAGNOSIS — E890 Postprocedural hypothyroidism: Secondary | ICD-10-CM | POA: Diagnosis not present

## 2022-10-23 DIAGNOSIS — F325 Major depressive disorder, single episode, in full remission: Secondary | ICD-10-CM | POA: Diagnosis not present

## 2022-10-24 ENCOUNTER — Other Ambulatory Visit (HOSPITAL_COMMUNITY): Payer: Self-pay | Admitting: Nurse Practitioner

## 2022-10-24 DIAGNOSIS — Z780 Asymptomatic menopausal state: Secondary | ICD-10-CM

## 2022-11-01 ENCOUNTER — Ambulatory Visit (HOSPITAL_COMMUNITY)
Admission: RE | Admit: 2022-11-01 | Discharge: 2022-11-01 | Disposition: A | Discharge status: Home / Self Care | Payer: Medicare PPO | Source: Ambulatory Visit | Attending: Nurse Practitioner | Admitting: Nurse Practitioner

## 2022-11-01 DIAGNOSIS — Z780 Asymptomatic menopausal state: Secondary | ICD-10-CM | POA: Insufficient documentation

## 2023-01-30 ENCOUNTER — Ambulatory Visit (INDEPENDENT_AMBULATORY_CARE_PROVIDER_SITE_OTHER): Payer: Medicare PPO | Admitting: General Surgery

## 2023-01-30 ENCOUNTER — Encounter: Payer: Self-pay | Admitting: General Surgery

## 2023-01-30 VITALS — BP 117/72 | HR 77 | Temp 97.7°F | Resp 12 | Ht 64.0 in | Wt 178.0 lb

## 2023-01-30 DIAGNOSIS — D1721 Benign lipomatous neoplasm of skin and subcutaneous tissue of right arm: Secondary | ICD-10-CM

## 2023-01-30 MED ORDER — OXYCODONE HCL 5 MG PO TABS: 5.0000 mg | ORAL_TABLET | ORAL | 0 refills | Status: DC | PRN

## 2023-01-30 NOTE — Patient Instructions (Signed)
Discharge Instructions:  Common Complaints: Pain at the incision site is common.  Some nausea is common and poor appetite. The main goal is to stay hydrated the first few days after surgery.   Diet/ Activity: Leave bandage on if possible for the first 48 hours. Then replace daily with neosporin on the incision.  Shower per your regular routine. Try to avoid getting the bandage wet in the first 48 hours but after the incision can get wet and just pat it dry. No submerging in a tub or pool.  Walk everyday for at least 15-20 minutes. Deep cough and move around every 1-2 hours in the first few days after surgery.  Limit excessive movement, lifting > 10 lbs, stretching with the limb if there is an incision on your arm/armpit or leg.   Limit stretching, pulling on your incision if it is located on other parts of your body.  Do not pick at the sutures.   Medication: Take tylenol and ibuprofen as needed for pain control, alternating every 4-6 hours.  Example:  Tylenol 1000mg  @ 6am, 12noon, 6pm, (Do not exceed 4000mg  of tylenol a day). Ibuprofen 800mg  @ 9am, 3pm, 9pm, 3am (Do not exceed 3600mg  of ibuprofen a day).  Take Roxicodone for breakthrough pain every 4 hours.  Take Colace for constipation related to narcotic pain medication. If you do not have a bowel movement in 2 days, take Miralax over the counter.  Drink plenty of water to also prevent constipation.   Contact Information: If you have questions or concerns, please call our office, [phone], Monday- Thursday 8AM-5PM and Friday 8AM-12Noon.  If it is after hours or on the weekend, please call [facility] Main Number, [phone], [phone] and ask to speak to the surgeon on call for Dr. Henreitta Leber at [facility].

## 2023-01-30 NOTE — Progress Notes (Signed)
[facility] Procedure Note  01/30/23  Pre-procedure Diagnosis: Right shoulder lipoma    Post-procedure Diagnosis: Same   Procedure(s) Performed: Excision of lipoma 8cm    Surgeon: Leatrice Jewels. Henreitta Leber, MD   Assistants: No qualified resident was available    Anesthesia: Lidocaine 1%    Specimens:  Lipoma   Estimated Blood Loss: Minimal  Wound Class: Clean    Procedure Indications: Olivia Arnold is a 72 yo who has a right sided lipoma on her posterior shoulder/ upper back that has been there a while and she wants removed. We discussed excision under local and risk of bleeding, infection, recurrence, finding something unexpected.   Findings: lipoma with finger projections    Procedure: The patient was taken to the procedure room and placed on her left side. The right back/ shoulder was prepared and draped in the usual sterile fashion. Lidocaine 1% was injected over the lipoma using the natural skin tension lines to determine the incision site. A scalp was used to incise and using sharp dissection carried down into the subcutaneous tissue the 8cm lipoma was excised. The lipoma had several finger projections and pieces.  The cavity was made hemostatic and the deep aspect was closed with 3-0 Vicryl interrupted. The skin was closed with 3-0 Nylon interrupted.   Neosporin and bandage was placed.   Final inspection revealed acceptable hemostasis. The patient tolerated the procedure well.   Discharge Instructions:  Common Complaints: Pain at the incision site is common.  Some nausea is common and poor appetite. The main goal is to stay hydrated the first few days after surgery.   Diet/ Activity: Leave bandage on if possible for the first 48 hours. Then replace daily with neosporin on the incision.  Shower per your regular routine. Try to avoid getting the bandage wet in the first 48 hours but after the incision can get wet and just pat it dry. No submerging in a tub or pool.   Walk everyday for at least 15-20 minutes. Deep cough and move around every 1-2 hours in the first few days after surgery.  Limit excessive movement, lifting > 10 lbs, stretching with the limb if there is an incision on your arm/armpit or leg.   Limit stretching, pulling on your incision if it is located on other parts of your body.  Do not pick at the sutures.   Medication: Take tylenol and ibuprofen as needed for pain control, alternating every 4-6 hours.  Example:  Tylenol 1000mg  @ 6am, 12noon, 6pm, (Do not exceed 4000mg  of tylenol a day). Ibuprofen 800mg  @ 9am, 3pm, 9pm, 3am (Do not exceed 3600mg  of ibuprofen a day).  Take Roxicodone for breakthrough pain every 4 hours.  Take Colace for constipation related to narcotic pain medication. If you do not have a bowel movement in 2 days, take Miralax over the counter.  Drink plenty of water to also prevent constipation.   Contact Information: If you have questions or concerns, please call our office, [phone], Monday- Thursday 8AM-5PM and Friday 8AM-12Noon.  If it is after hours or on the weekend, please call [facility], [phone], [phone] and ask to speak to the surgeon on call for Dr. Henreitta Leber at [facility] ([facility]    Algis Greenhouse, MD [facility] [address] [place], [place] [address] [phone] (office)

## 2023-01-31 ENCOUNTER — Other Ambulatory Visit: Payer: Self-pay | Admitting: Family Medicine

## 2023-01-31 DIAGNOSIS — D1721 Benign lipomatous neoplasm of skin and subcutaneous tissue of right arm: Secondary | ICD-10-CM

## 2023-02-04 LAB — TISSUE SPECIMEN

## 2023-02-04 LAB — PATHOLOGY REPORT

## 2023-02-13 ENCOUNTER — Ambulatory Visit (INDEPENDENT_AMBULATORY_CARE_PROVIDER_SITE_OTHER): Payer: Medicare PPO | Admitting: General Surgery

## 2023-02-13 ENCOUNTER — Encounter: Payer: Self-pay | Admitting: General Surgery

## 2023-02-13 VITALS — BP 133/71 | HR 80 | Temp 98.0°F | Resp 14 | Ht 64.0 in | Wt 179.0 lb

## 2023-02-13 DIAGNOSIS — D1721 Benign lipomatous neoplasm of skin and subcutaneous tissue of right arm: Secondary | ICD-10-CM

## 2023-02-13 NOTE — Progress Notes (Signed)
[facility]  Doing well. Pathology all benign. No issues.  BP 133/71   Pulse 80   Temp 98 F (36.7 C) (Oral)   Resp 14   Ht 5\' 4"  (1.626 m)   Wt 179 lb (81.2 kg)   SpO2 98%   BMI 30.73 kg/m  Sutures removed, no erythema or drainage, steri strips placed   Patient s/p right upper back/ shoulder lipoma. Doing well.  Steristrips will peel off in the next 5-7 days. You can remove them once they are peeling off. It is ok to shower. Pat the area dry.  Call with issues   Algis Greenhouse, MD [facility] [address] [place], [place] [address] [phone] (office)

## 2023-02-13 NOTE — Patient Instructions (Signed)
Steristrips will peel off in the next 5-7 days. You can remove them once they are peeling off. It is ok to shower. Pat the area dry.   Call with issues.

## 2023-03-21 ENCOUNTER — Encounter: Payer: Self-pay | Admitting: Internal Medicine

## 2023-03-21 ENCOUNTER — Ambulatory Visit (HOSPITAL_COMMUNITY)
Admission: RE | Admit: 2023-03-21 | Discharge: 2023-03-21 | Disposition: A | Discharge status: Home / Self Care | Payer: Medicare PPO | Source: Ambulatory Visit | Attending: Internal Medicine | Admitting: Internal Medicine

## 2023-03-21 ENCOUNTER — Ambulatory Visit: Payer: Medicare PPO | Admitting: Internal Medicine

## 2023-03-21 VITALS — BP 152/74 | HR 83 | Ht 64.0 in | Wt 180.0 lb

## 2023-03-21 DIAGNOSIS — R0609 Other forms of dyspnea: Secondary | ICD-10-CM

## 2023-03-21 DIAGNOSIS — J45909 Unspecified asthma, uncomplicated: Secondary | ICD-10-CM

## 2023-03-21 NOTE — Patient Instructions (Addendum)
Pantoprazole (protonix) 40 mg   Take  30-60 min before first meal of the day and added  Pepcid (famotidine)  20 mg after supper until symptoms are gone for at least a week   For congestion > mucinex D  one twice daily    GERD (REFLUX)  is an extremely common cause of respiratory symptoms just like yours , many times with no obvious heartburn at all.    It can be treated with medication, but also with lifestyle changes including elevation of the head of your bed (ideally with 6 -8inch blocks under the headboard of your bed),  Smoking cessation, avoidance of late meals, excessive alcohol, and avoid fatty foods, chocolate, peppermint, colas, red wine, and acidic juices such as orange juice.  NO MINT OR MENTHOL PRODUCTS SO NO COUGH DROPS  USE SUGARLESS CANDY INSTEAD (Jolley ranchers or Stover's or Life Savers) or even ice chips will also do - the key is to swallow to prevent all throat clearing. NO OIL BASED VITAMINS - use powdered substitutes.  Avoid fish oil when coughing.   Please remember to go to the lab department   for your tests - we will call you with the results when they are available.  Please remember to go to the  x-ray department  @  [facility] for your tests - we will call you with the results when they are available      Call if not better by first of week - if condition worsens in meantime you should be re-evaluated in ER

## 2023-03-21 NOTE — Progress Notes (Unsigned)
Olivia Arnold, female    DOB: 01/22/1951,   MRN: [id number]   Brief patient profile:  72yowf quit smoking around 1990 with a background  of seaonal rhinitis in fall shots x 2 years helped and never needed inhalers  self-referred to pulmonary clinic in [place]  03/29/2021  with acute illness early 12/2020 she shared with her husband sob, cough fatigue neg covid 19 testing > ER /UC x only better p inhaler only 50 %      History of Present Illness  03/29/2021  Pulmonary/ 1st office eval/ Amore Ackman / [place] [facility]  Chief Complaint  Patient presents with   Consult    Suspected COPD   3 h last albuterol/ and pred 50 mg am of ov  Dyspnea:  50% flat and slow but hills are tought Cough: flat bed/ on side does better on L side down lots of mucoid sputum Sleep: up coughing most nights SABA use: seems to help some  Rec Mucinex 1200 mg every 12 hours and supplement with phenergan dm if can afford to be sleepy and if not sleepy then ok to add on tessilon  Late add:  advised to finish prednisone rx  Protonix (pantoprazole) Take 30-60 min before first meal of the day and Pepcid 20 mg one  hour before bedtime plus chlorpheniramine 4 mg x 1-2   (both available over the counter)  until cough is completely gone for at least a week without the need for cough suppression GERD diet reviewed, bed blocks rec  For shortness of breath ok to use albuterol up to every 4 hours but the goal is less than twice weekly     03/21/2023  ACUTE  ov/[place] office/Jianna Drabik re: AB  maint on singulair   - worse since  sept 2024 with increasing nasal congestion  rx zyrtec but by October 2024 started coughing> thick min yellowish- inhaler didn't help with pred/ erythromicin improved cough but still feel throat and nose are swollen  and no better with albuterol  Chief Complaint  Patient presents with   Asthma  Dyspnea:  more trouble mb and back level x 100 ft x a month with constant midline chest discomfort no worse with  exertion  Cough: now clear mucus p rx abx  Sleeping: flat bed / one pillow s   resp cc or am flares  SABA use: 2 days prior to OV  and didn't help 02: none   No obvious day to day or daytime variability or assoc purulent sputum or mucus plugs or hemoptysis or cp or chest tightness, subjective wheeze or overt  hb symptoms.    Also denies any obvious fluctuation of symptoms with weather or environmental changes or other aggravating or alleviating factors except as outlined above   No unusual exposure hx or h/o childhood pna/ asthma or knowledge of premature birth.  Current Allergies, Complete Past Medical History, Past Surgical History, Family History, and Social History were reviewed in [employer] record.  ROS  The following are not active complaints unless bolded Hoarseness, sore throat/globus, dysphagia, dental problems, itching, sneezing,  nasal congestion or discharge of excess mucus or purulent secretions, ear ache,   fever, chills, sweats, unintended wt loss or wt gain, classically pleuritic or exertional cp,  orthopnea pnd or arm/hand swelling  or leg swelling, presyncope, palpitations, abdominal pain, anorexia, nausea, vomiting, diarrhea  or change in bowel habits or change in bladder habits, change in stools or change in urine, dysuria, hematuria,  rash,  arthralgias, visual complaints, headache, numbness, weakness or ataxia or problems with walking or coordination,  change in mood or  memory.        Current Meds  Medication Sig   albuterol (VENTOLIN HFA) 108 (90 Base) MCG/ACT inhaler Inhale 2 puffs into the lungs every 6 (six) hours as needed for wheezing or shortness of breath.   Armodafinil 150 MG tablet Take 150 mg by mouth every morning.   atorvastatin (LIPITOR) 20 MG tablet Take 20 mg by mouth at bedtime.   augmented betamethasone dipropionate (DIPROLENE-AF) 0.05 % ointment APPLY TOPICALLY DAILY AS DIRECTED. (Patient taking differently: 1 Application  daily.)   calcium-vitamin D (OSCAL WITH D) 500-5 MG-MCG tablet Take 1 tablet by mouth.   cetirizine (ZYRTEC) 10 MG tablet Take 10 mg by mouth daily.   cholecalciferol (VITAMIN D3) 25 MCG (1000 UT) tablet Take 1,000 Units by mouth daily.   DULoxetine (CYMBALTA) 60 MG capsule Take 60 mg by mouth daily.   fluticasone (FLONASE) 50 MCG/ACT nasal spray Place 2 sprays into both nostrils daily.   Folic Acid (FOLATE PO) Take by mouth.   folic acid (FOLVITE) 400 MCG tablet Take 400 mcg by mouth daily.   hydrochlorothiazide (MICROZIDE) 12.5 MG capsule TAKE 1 CAPSULE(12.5 MG) BY MOUTH DAILY (Patient taking differently: Take 12.5 mg by mouth daily.)   levothyroxine (SYNTHROID) 88 MCG tablet Take 88 mcg by mouth every morning.   losartan (COZAAR) 100 MG tablet Take 1 tablet (100 mg total) by mouth daily.   montelukast (SINGULAIR) 10 MG tablet Take 1 tablet (10 mg total) by mouth at bedtime.   Omega-3 Fatty Acids (FISH OIL) 1000 MG CPDR Take 2,000 mg by mouth daily.   pantoprazole (PROTONIX) 40 MG tablet Take 40 mg by mouth daily.   PREMARIN vaginal cream Place 0.5 Applicatorfuls vaginally 2 (two) times a week.             Past Medical History:  Diagnosis Date   ADHD (attention deficit hyperactivity disorder)    Anxiety    Anxiety disorder, unspecified    Asthma    Atypical mole 11/09/2003   Right Scapula - minimal   Atypical mole 10/10/2008   Right Mid Paraspinal - slight to moderate    Atypical mole 12/06/2008   Left Chest - moderate to severe [place])   Atypical mole 08/23/2013   Right Post Shoulder, Sup - moderate   Atypical mole 08/23/2013   Right Post Shoulder, Inf - moderate   Atypical mole 08/23/2013   Left Upper Shoulder, Post - mild   Chest pain, unspecified    Depression    Essential (primary) hypertension    Essential hypertension    History of cardiac catheterization    [facility] July 2000 - normal coronaries   Hypothyroidism    Hypothyroidism, unspecified    Major depressive  disorder, single episode, unspecified    Rosacea, unspecified    Unspecified asthma, uncomplicated        Objective:    Wts  03/21/2023     180   02/13/23 179 lb (81.2 kg)  01/30/23 178 lb (80.7 kg)  10/15/22 164 lb (74.4 kg)      Vital signs reviewed  03/21/2023  - Note at rest 02 sats  97% on RA   General appearance:  amb wf nad mild pseudowheeze    HEENT : Oropharynx  clear     Nasal turbinates mod edema / no polyps or purulent secretions    NECK :  without  apparent JVD/ palpable Nodes/TM    LUNGS: no acc muscle use,  Nl contour chest which is clear to A and P bilaterally without cough on insp or exp maneuvers   CV:  RRR  no s3 or murmur or increase in P2, and no edema   ABD:  soft and nontender    MS:  Nl gait/ ext warm without deformities Or obvious joint restrictions  calf tenderness, cyanosis or clubbing    SKIN: warm and dry without lesions    NEURO:  alert, approp, nl sensorium with  no motor or cerebellar deficits apparent.      CXR PA and Lateral:   03/21/2023 :    I personally reviewed images and impression is as follows:     Min kyphosis/ no acute cardiopulmonary findings   Labs ordered/ reviewed:      Chemistry      Component Value Date/Time   NA 142 03/21/2023 1103   K 4.1 03/21/2023 1103   CL 101 03/21/2023 1103   CO2 25 03/21/2023 1103   BUN 21 03/21/2023 1103   CREATININE 0.76 03/21/2023 1103   GLU 123 04/09/2019 0000      Component Value Date/Time   CALCIUM 9.6 03/21/2023 1103   ALKPHOS 71 08/19/2022 0409   AST 26 08/19/2022 0409   ALT 26 08/19/2022 0409   BILITOT 0.8 08/19/2022 0409        Lab Results  Component Value Date   WBC 10.2 03/21/2023   HGB 13.8 03/21/2023   HCT 42.3 03/21/2023   MCV 94 03/21/2023   PLT 357 03/21/2023        Lab Results  Component Value Date   TSH 0.51 04/09/2019      Troponin 1   03/21/2023    7     Assessment

## 2023-03-22 ENCOUNTER — Encounter: Payer: Self-pay | Admitting: Internal Medicine

## 2023-03-22 NOTE — Assessment & Plan Note (Signed)
Onset early aug 2022 with neg covid testing/ husband with similar illness - cyclical cough rx 03/29/2021 >>> resolved  - 03/21/2023  > recurrent flare  xone month assoc with refractory rhinitis>  off gerd rx  > rx mucinex d and max gerd rx and f/u in one week if not better  - Allergy screen 03/21/2023 >  Eos 0. 3 during flare   Assoc with sense of persistent sinus dz and midlline cp x one month are atypical features suggesting sinobronchial syndrome and / or "reflex to reflux" as a cause of Upper airway cough syndrome (previously labeled PNDS),  is so named because it's frequently impossible to sort out how much is  CR/sinusitis with freq throat clearing (which can be related to primary GERD)   vs  causing  secondary (" extra esophageal")  GERD from wide swings in gastric pressure that occur with throat clearing, often  promoting self use of mint and menthol lozenges that reduce the lower esophageal sphincter tone and exacerbate the problem further in a cyclical fashion.   These are the same pts (now being labeled as having "irritable larynx syndrome" by some cough centers) who not infrequently have a history of having failed to tolerate ace inhibitors,  dry powder inhalers or biphosphonates or report having atypical/extraesophageal reflux symptoms that don't respond to standard doses of PPI  and are easily confused as having aecopd or asthma flares by even experienced allergists/ pulmonologists (myself included).    Reviewed strategy to rx gerd aggressively at the next flare on the first day to prevent cyclical cough.  Reviewed strategy to use decongestants approp but avoid further abx or steroids for now with f/u in a week if not improved on mucinex d bid prn.  Re SABA :  I spent extra time with pt today reviewing appropriate use of albuterol for prn use on exertion with the following points: 1) saba is for relief of sob that does not improve by walking a slower pace or resting but rather if the pt  does not improve after trying this first. 2) If the pt is convinced, as many are, that saba helps recover from activity faster then it's easy to tell if this is the case by re-challenging : ie stop, take the inhaler, then p 5 minutes try the exact same activity (intensity of workload) that just caused the symptoms and see if they are substantially diminished or not after saba 3) if there is an activity that reproducibly causes the symptoms, try the saba 15 min before the activity on alternate days   If in fact the saba really does help, then fine to continue to use it prn but advised may need to look closer at the maintenance regimen (for now only singulair)  being used to achieve better control of airways disease with exertion.  Each maintenance medication was reviewed in detail including emphasizing most importantly the difference between maintenance and prns and under what circumstances the prns are to be triggered using an action plan format where appropriate.  Total time for H and P, chart review, counseling, reviewing hfa device(s) and generating customized AVS unique to this ACUTE office visit / same day charting = 35 min

## 2023-03-25 LAB — CBC WITH DIFFERENTIAL/PLATELET
Basophils Absolute: 0.1 10*3/uL (ref 0.0–0.2)
Basos: 1 %
EOS (ABSOLUTE): 0.3 10*3/uL (ref 0.0–0.4)
Eos: 3 %
Hematocrit: 42.3 % (ref 34.0–46.6)
Hemoglobin: 13.8 g/dL (ref 11.1–15.9)
Immature Grans (Abs): 0 10*3/uL (ref 0.0–0.1)
Immature Granulocytes: 0 %
Lymphocytes Absolute: 5.3 10*3/uL — ABNORMAL HIGH (ref 0.7–3.1)
Lymphs: 52 %
MCH: 30.5 pg (ref 26.6–33.0)
MCHC: 32.6 g/dL (ref 31.5–35.7)
MCV: 94 fL (ref 79–97)
Monocytes Absolute: 0.6 10*3/uL (ref 0.1–0.9)
Monocytes: 6 %
Neutrophils Absolute: 3.9 10*3/uL (ref 1.4–7.0)
Neutrophils: 38 %
Platelets: 357 10*3/uL (ref 150–450)
RBC: 4.52 x10E6/uL (ref 3.77–5.28)
RDW: 11.7 % (ref 11.7–15.4)
WBC: 10.2 10*3/uL (ref 3.4–10.8)

## 2023-03-25 LAB — TROPONIN T: Troponin T (Highly Sensitive): 7 ng/L (ref 0–14)

## 2023-03-25 LAB — BASIC METABOLIC PANEL
BUN/Creatinine Ratio: 28 (ref 12–28)
BUN: 21 mg/dL (ref 8–27)
CO2: 25 mmol/L (ref 20–29)
Calcium: 9.6 mg/dL (ref 8.7–10.3)
Chloride: 101 mmol/L (ref 96–106)
Creatinine, Ser: 0.76 mg/dL (ref 0.57–1.00)
Glucose: 84 mg/dL (ref 70–99)
Potassium: 4.1 mmol/L (ref 3.5–5.2)
Sodium: 142 mmol/L (ref 134–144)
eGFR: 84 mL/min/{1.73_m2} (ref >59)

## 2023-03-25 LAB — BRAIN NATRIURETIC PEPTIDE: BNP: 18.6 pg/mL (ref 0.0–100.0)

## 2023-03-25 NOTE — Assessment & Plan Note (Signed)
Labs reviewed - no evidence of anemia, chf, renal dz or IHD as cause for doe with mildline cp not better with inhalers, not worse with ex  not pleuritic so GI  or MSCP from coughing most likely cause with close f/u prn

## 2023-08-06 ENCOUNTER — Other Ambulatory Visit: Payer: Self-pay | Admitting: Obstetrics

## 2023-08-06 DIAGNOSIS — E2839 Other primary ovarian failure: Secondary | ICD-10-CM

## 2023-09-25 ENCOUNTER — Ambulatory Visit (INDEPENDENT_AMBULATORY_CARE_PROVIDER_SITE_OTHER): Payer: Medicare PPO | Admitting: Otolaryngology

## 2023-09-25 VITALS — BP 128/78 | HR 82 | Ht 64.0 in | Wt 180.0 lb

## 2023-09-25 DIAGNOSIS — J310 Chronic rhinitis: Secondary | ICD-10-CM

## 2023-09-25 DIAGNOSIS — H903 Sensorineural hearing loss, bilateral: Secondary | ICD-10-CM

## 2023-09-25 DIAGNOSIS — R0981 Nasal congestion: Secondary | ICD-10-CM

## 2023-09-25 NOTE — Progress Notes (Signed)
 Patient ID: Olivia Arnold, female   DOB: April 09, 1951, 73 y.o.   MRN: [id number]  Follow-up: Hearing loss, nasal congestion  HPI: The patient is a 73 year old female who returns today for her follow-up evaluation.  The patient was last seen 1 year ago.  At that time, she was noted to have bilateral high-frequency sensorineural hearing loss, consistent with presbycusis.  She also has a history of environmental allergies.  She is currently on Zyrtec and Flonase .  The patient returns today reporting no significant change in her hearing.  She currently wears bilateral hearing aids.  She has noted significant benefit with the hearing aids.  Currently she denies any otalgia, otorrhea, or vertigo.  She has occasional nasal congestion.  Exam: General: Communicates without difficulty, well nourished, no acute distress. Head: Normocephalic, no evidence injury, no tenderness, facial buttresses intact without stepoff. Eyes: PERRL, EOMI. No scleral icterus, conjunctivae clear. Neuro: CN II exam reveals vision grossly intact. No nystagmus at any point of gaze. Ears: Auricles well formed without lesions. Ear canals are intact without mass or lesion. The TMs are intact without fluid. Nose: External evaluation reveals normal support and skin without lesions. Dorsum is intact. Anterior rhinoscopy reveals congested mucosa over anterior aspect of inferior turbinates and intact septum. No purulence noted. Oral:  Oral cavity and oropharynx are intact, symmetric, without erythema or edema. Mucosa is moist without lesions. Neck: Full range of motion without pain. There is no significant lymphadenopathy. No masses palpable. Thyroid bed within normal limits to palpation. Parotid glands and submandibular glands equal bilaterally without mass. Trachea is midline. Neuro:  CN 2-12 grossly intact. Gait normal. Vestibular: No nystagmus at any point of gaze. The cerebellar examination is unremarkable.   Assessment: 1. The patient's ear  canals, tympanic membranes and middle ear spaces are normal. 2.  Subjectively stable bilateral high-frequency sensorineural hearing loss, consistent with presbycusis.  3.  Chronic rhinitis with nasal mucosal congestion.  Plan:  1. The physical exam findings are reviewed with the patient.  2. Continue with bilateral hearing aids.  3.  Continue with Flonase  nasal spray and Zyrtec daily. 4.  The patient will follow up for re-evaluation in 1 year.

## 2023-09-26 DIAGNOSIS — H903 Sensorineural hearing loss, bilateral: Secondary | ICD-10-CM | POA: Insufficient documentation

## 2023-09-26 DIAGNOSIS — J310 Chronic rhinitis: Secondary | ICD-10-CM | POA: Insufficient documentation

## 2023-12-01 ENCOUNTER — Ambulatory Visit (INDEPENDENT_AMBULATORY_CARE_PROVIDER_SITE_OTHER): Payer: Self-pay | Admitting: Audiology

## 2023-12-01 DIAGNOSIS — H903 Sensorineural hearing loss, bilateral: Secondary | ICD-10-CM

## 2023-12-01 NOTE — Progress Notes (Signed)
  [address] [place], [place] [address] [phone]  Hearing Aid Check     Olivia Arnold comes to drop off both aids and charger for a hearing aid check.      Right Left  Hearing aid manufacturer Renaldo Kirschner M70-R SN: [id number] Renaldo Kirschner M70-R SN: [id number]  Hearing aid style Receiver in the canal Receiver in the canal  Hearing aid battery rechargeable rechargeable  Receiver 40M 40M  Dome/ custom earpiece Large double dome Large double dome  Retention wire no no  Warranty expiration date 02-19-2021 02-19-2021  Loss and Damage expired expired  Initial fitting date 11-30-2018 11-30-2018  [facility]    Chief complaint: Patient reports right aid is not working.  Actions taken: Inspection of the device and listening check showed that the right receiver wire was broken. Cleaned both aids, replaced the right receiver wire and charged them.   No charges today. Charges will be applicable at pick up.   Patient will be called tomorrow morning to let her know the aid and charger are ready to be picked up the aid pay the repair fees at pick up.  Recommend: Return for a hearing aid check. Return for a hearing evaluation and to see an ENT, if concerns with hearing changes arise.    [name] [name], AUD

## 2023-12-02 ENCOUNTER — Telehealth (INDEPENDENT_AMBULATORY_CARE_PROVIDER_SITE_OTHER): Payer: Self-pay | Admitting: Audiology

## 2023-12-02 NOTE — Telephone Encounter (Signed)
 Called patient per Dr. Tex request to let her know that the Right Receiver of her Hearing Aid was broken.  I informed the patient that the cost for repair would be $110 total ($80 for the Receiver that is out of warranty + $30 Service Fee).  The Patient agreed to pay for the repairs and stated she come by sometime tomorrow, 12/03/2023 to pay and pick it up.

## 2023-12-03 ENCOUNTER — Ambulatory Visit (INDEPENDENT_AMBULATORY_CARE_PROVIDER_SITE_OTHER): Payer: Self-pay | Admitting: Audiology

## 2023-12-03 DIAGNOSIS — H903 Sensorineural hearing loss, bilateral: Secondary | ICD-10-CM

## 2023-12-05 NOTE — Progress Notes (Signed)
  [address] [place], [place] [address] [phone]  Hearing Aid Check     Olivia Arnold comes as a walk in to pick up her aids . The front desk helped the patient.       Right Left  Hearing aid manufacturer Renaldo Kirschner M70-R SN: [id number] Renaldo Kirschner M70-R SN: [id number]  Hearing aid style Receiver in the canal Receiver in the canal  Hearing aid battery rechargeable rechargeable  Receiver 58M 58M  Dome/ custom earpiece Large double dome Large double dome  Retention wire no no  Warranty expiration date 02-19-2021 02-19-2021  Loss and Damage expired expired  Initial fitting date 11-30-2018 11-30-2018  Device was fit at: [facility]     Olivia Arnold comes to pick up both aids and charger after cleaning and repair.  Please see previous noted for services rendered.   Services fee: $110 ( $30 service fee + [phone]  receiver fee) was paid at checkout  Recommend: Return for a hearing aid check,  as needed. Return for a hearing evaluation and to see an ENT, if concerns with hearing changes arise.    [name], AUD

## 2023-12-11 ENCOUNTER — Ambulatory Visit (INDEPENDENT_AMBULATORY_CARE_PROVIDER_SITE_OTHER): Admitting: Audiology

## 2024-03-24 ENCOUNTER — Ambulatory Visit (INDEPENDENT_AMBULATORY_CARE_PROVIDER_SITE_OTHER): Payer: Self-pay | Admitting: Audiology

## 2024-03-24 DIAGNOSIS — H903 Sensorineural hearing loss, bilateral: Secondary | ICD-10-CM

## 2024-03-26 ENCOUNTER — Telehealth (INDEPENDENT_AMBULATORY_CARE_PROVIDER_SITE_OTHER): Payer: Self-pay | Admitting: Audiology

## 2024-03-26 NOTE — Telephone Encounter (Signed)
 Patient's husband, Rio Taber, left a voicemail message checking on the status of his wife's (the patient's) hearing aid repair.  The hearing aid was dropped off on 03/24/2024.  I called the patient and spoke with her husband.  I told him that the wire was ordered on 03/25/2024 and we will call them when the part comes in and the hearing aid is ready for pickup.

## 2024-03-26 NOTE — Progress Notes (Signed)
  [address], [address] [place], [place] [address] [phone]  Hearing Aid Check    Bhumi Godbey walked in to drop off her right aid.      Right Left  Hearing aid manufacturer Renaldo Kirschner M70-R SN: [id number] Renaldo Kirschner M70-R SN: [id number]  Hearing aid style Receiver in the canal Receiver in the canal  Hearing aid battery rechargeable rechargeable  Receiver 35M 35M  Dome/ custom earpiece Large double dome Large double dome  Retention wire no no  Warranty expiration date 02-19-2021 02-19-2021  Loss and Damage expired expired  Initial fitting date 11-30-2018 11-30-2018  Device was [facility] at: [facility]     Actions taken: The receiver wire is broken. A replacement was not available in stock. A new receiver wire has been ordered.  Services fee: $0 was paid today. Patient will be charged $110 at pick up.    Recommend: Return for a hearing aid check ,as needed. Return for a hearing evaluation and to see an ENT, if concerns with hearing changes arise.    Ines Rebel [name], AUD

## 2024-03-29 ENCOUNTER — Encounter (INDEPENDENT_AMBULATORY_CARE_PROVIDER_SITE_OTHER): Payer: Self-pay

## 2024-03-29 ENCOUNTER — Telehealth (INDEPENDENT_AMBULATORY_CARE_PROVIDER_SITE_OTHER): Payer: Self-pay | Admitting: Audiology

## 2024-03-29 NOTE — Telephone Encounter (Signed)
 Called patient per Dr. Tex request and LVM letting her know that her hearing aid and charger are ready for pick up.  The amount due at pick up is $110.  The new receiver is out of warranty.  I requested pick up be by 4pm before the cash drawer is closed for the day.  I also sent a MyChart msg.

## 2024-04-02 ENCOUNTER — Ambulatory Visit (INDEPENDENT_AMBULATORY_CARE_PROVIDER_SITE_OTHER): Payer: Self-pay | Admitting: Audiology

## 2024-04-02 DIAGNOSIS — H903 Sensorineural hearing loss, bilateral: Secondary | ICD-10-CM

## 2024-04-02 NOTE — Progress Notes (Signed)
  [address] [place], [place] [address] [phone]  Hearing Aid Check     Jodie Cavey Demetro's husband walked in to pick up her right hearing aid.     Right Left  Hearing aid manufacturer Renaldo Kirschner M70-R SN: [id number] Renaldo Kirschner M70-R SN: [id number]  Hearing aid style Receiver in the canal Receiver in the canal  Hearing aid battery rechargeable rechargeable  Receiver 55M 55M  Dome/ custom earpiece Large double dome Large double dome  Retention wire no no  Warranty expiration date 02-19-2021 02-19-2021  Loss and Damage expired expired  Initial fitting date 11-30-2018 11-30-2018  Device was fit at: [facility]     Actions taken: New receiver wire was attached to her hearing aid. Listening check was good.   Services fee: $110 was paid at checkout.  Recommend: Return for a hearing aid check , as needed. Return for a hearing evaluation and to see an ENT, if concerns with hearing changes arise.    Kwamaine Cuppett [name], AUD
